# Patient Record
Sex: Female | Born: 1980
Health system: Southern US, Community
[De-identification: ages and names within clinical notes are randomized; demographics above are authoritative.]

## PROBLEM LIST (undated history)

## (undated) DIAGNOSIS — E669 Obesity, unspecified: Secondary | ICD-10-CM

## (undated) DIAGNOSIS — K219 Gastro-esophageal reflux disease without esophagitis: Secondary | ICD-10-CM

## (undated) DIAGNOSIS — K5790 Diverticulosis of intestine, part unspecified, without perforation or abscess without bleeding: Secondary | ICD-10-CM

## (undated) DIAGNOSIS — D649 Anemia, unspecified: Secondary | ICD-10-CM

## (undated) DIAGNOSIS — K5900 Constipation, unspecified: Secondary | ICD-10-CM

## (undated) DIAGNOSIS — E079 Disorder of thyroid, unspecified: Secondary | ICD-10-CM

## (undated) DIAGNOSIS — K5792 Diverticulitis of intestine, part unspecified, without perforation or abscess without bleeding: Secondary | ICD-10-CM

## (undated) DIAGNOSIS — G479 Sleep disorder, unspecified: Secondary | ICD-10-CM

## (undated) DIAGNOSIS — T7840XA Allergy, unspecified, initial encounter: Secondary | ICD-10-CM

## (undated) DIAGNOSIS — H04123 Dry eye syndrome of bilateral lacrimal glands: Secondary | ICD-10-CM

## (undated) HISTORY — DX: Allergy, unspecified, initial encounter: T78.40XA

## (undated) HISTORY — DX: Sleep disorder, unspecified: G47.9

## (undated) HISTORY — DX: Gastro-esophageal reflux disease without esophagitis: K21.9

## (undated) HISTORY — DX: Constipation, unspecified: K59.00

## (undated) HISTORY — PX: IUD REMOVAL: SHX5392

## (undated) HISTORY — PX: TUBAL LIGATION: SHX77

## (undated) HISTORY — DX: Disorder of thyroid, unspecified: E07.9

## (undated) HISTORY — DX: Anemia, unspecified: D64.9

## (undated) HISTORY — DX: Dry eye syndrome of bilateral lacrimal glands: H04.123

---

## 2000-12-01 ENCOUNTER — Emergency Department (HOSPITAL_COMMUNITY): Admission: EM | Admit: 2000-12-01 | Discharge: 2000-12-01 | Payer: Self-pay | Admitting: Emergency Medicine

## 2001-03-18 ENCOUNTER — Emergency Department (HOSPITAL_COMMUNITY): Admission: EM | Admit: 2001-03-18 | Discharge: 2001-03-18 | Payer: Self-pay | Admitting: *Deleted

## 2002-01-04 ENCOUNTER — Inpatient Hospital Stay (HOSPITAL_COMMUNITY): Admission: AD | Admit: 2002-01-04 | Discharge: 2002-01-04 | Payer: Self-pay | Admitting: *Deleted

## 2002-01-07 ENCOUNTER — Inpatient Hospital Stay (HOSPITAL_COMMUNITY): Admission: AD | Admit: 2002-01-07 | Discharge: 2002-01-07 | Payer: Self-pay | Admitting: *Deleted

## 2002-02-03 ENCOUNTER — Inpatient Hospital Stay (HOSPITAL_COMMUNITY): Admission: AD | Admit: 2002-02-03 | Discharge: 2002-02-03 | Payer: Self-pay | Admitting: Obstetrics and Gynecology

## 2002-02-15 ENCOUNTER — Emergency Department (HOSPITAL_COMMUNITY): Admission: EM | Admit: 2002-02-15 | Discharge: 2002-02-15 | Payer: Self-pay | Admitting: Emergency Medicine

## 2002-02-26 ENCOUNTER — Other Ambulatory Visit: Admission: RE | Admit: 2002-02-26 | Discharge: 2002-02-26 | Payer: Self-pay | Admitting: Obstetrics and Gynecology

## 2002-05-07 ENCOUNTER — Ambulatory Visit (HOSPITAL_COMMUNITY): Admission: RE | Admit: 2002-05-07 | Discharge: 2002-05-07 | Payer: Self-pay | Admitting: Obstetrics and Gynecology

## 2002-05-07 ENCOUNTER — Encounter: Payer: Self-pay | Admitting: Obstetrics and Gynecology

## 2002-06-04 ENCOUNTER — Encounter: Payer: Self-pay | Admitting: Obstetrics and Gynecology

## 2002-06-04 ENCOUNTER — Observation Stay (HOSPITAL_COMMUNITY): Admission: AD | Admit: 2002-06-04 | Discharge: 2002-06-05 | Payer: Self-pay | Admitting: *Deleted

## 2002-06-05 ENCOUNTER — Encounter: Payer: Self-pay | Admitting: Obstetrics and Gynecology

## 2002-06-10 ENCOUNTER — Observation Stay (HOSPITAL_COMMUNITY): Admission: AD | Admit: 2002-06-10 | Discharge: 2002-06-10 | Payer: Self-pay | Admitting: Obstetrics and Gynecology

## 2002-06-10 ENCOUNTER — Encounter: Payer: Self-pay | Admitting: Obstetrics and Gynecology

## 2002-06-11 ENCOUNTER — Encounter: Payer: Self-pay | Admitting: Obstetrics and Gynecology

## 2002-06-11 ENCOUNTER — Inpatient Hospital Stay (HOSPITAL_COMMUNITY): Admission: AD | Admit: 2002-06-11 | Discharge: 2002-06-11 | Payer: Self-pay | Admitting: *Deleted

## 2002-06-12 ENCOUNTER — Inpatient Hospital Stay (HOSPITAL_COMMUNITY): Admission: AD | Admit: 2002-06-12 | Discharge: 2002-06-22 | Payer: Self-pay | Admitting: Obstetrics and Gynecology

## 2002-06-12 ENCOUNTER — Encounter: Payer: Self-pay | Admitting: Obstetrics and Gynecology

## 2002-06-12 ENCOUNTER — Encounter (HOSPITAL_COMMUNITY): Admission: RE | Admit: 2002-06-12 | Discharge: 2002-06-30 | Payer: Self-pay | Admitting: Obstetrics and Gynecology

## 2002-06-16 ENCOUNTER — Encounter: Payer: Self-pay | Admitting: Obstetrics and Gynecology

## 2002-06-19 ENCOUNTER — Encounter: Payer: Self-pay | Admitting: Obstetrics and Gynecology

## 2002-06-23 ENCOUNTER — Encounter: Payer: Self-pay | Admitting: Obstetrics and Gynecology

## 2002-06-24 ENCOUNTER — Encounter: Payer: Self-pay | Admitting: Obstetrics and Gynecology

## 2002-06-29 ENCOUNTER — Inpatient Hospital Stay (HOSPITAL_COMMUNITY): Admission: AD | Admit: 2002-06-29 | Discharge: 2002-07-03 | Payer: Self-pay | Admitting: Obstetrics and Gynecology

## 2002-06-29 ENCOUNTER — Encounter: Payer: Self-pay | Admitting: Obstetrics and Gynecology

## 2002-06-29 ENCOUNTER — Encounter (INDEPENDENT_AMBULATORY_CARE_PROVIDER_SITE_OTHER): Payer: Self-pay

## 2002-06-30 ENCOUNTER — Encounter: Payer: Self-pay | Admitting: Obstetrics and Gynecology

## 2003-07-16 ENCOUNTER — Emergency Department (HOSPITAL_COMMUNITY): Admission: EM | Admit: 2003-07-16 | Discharge: 2003-07-16 | Payer: Self-pay | Admitting: Emergency Medicine

## 2003-07-16 ENCOUNTER — Encounter: Payer: Self-pay | Admitting: Emergency Medicine

## 2003-08-13 ENCOUNTER — Emergency Department (HOSPITAL_COMMUNITY): Admission: EM | Admit: 2003-08-13 | Discharge: 2003-08-13 | Payer: Self-pay

## 2003-08-16 ENCOUNTER — Emergency Department (HOSPITAL_COMMUNITY): Admission: EM | Admit: 2003-08-16 | Discharge: 2003-08-17 | Payer: Self-pay | Admitting: *Deleted

## 2004-02-11 ENCOUNTER — Emergency Department (HOSPITAL_COMMUNITY): Admission: EM | Admit: 2004-02-11 | Discharge: 2004-02-11 | Payer: Self-pay | Admitting: Emergency Medicine

## 2004-07-20 ENCOUNTER — Emergency Department (HOSPITAL_COMMUNITY): Admission: EM | Admit: 2004-07-20 | Discharge: 2004-07-20 | Payer: Self-pay | Admitting: Emergency Medicine

## 2005-10-10 ENCOUNTER — Emergency Department (HOSPITAL_COMMUNITY): Admission: EM | Admit: 2005-10-10 | Discharge: 2005-10-11 | Payer: Self-pay | Admitting: Emergency Medicine

## 2005-10-12 ENCOUNTER — Emergency Department: Payer: Self-pay | Admitting: Emergency Medicine

## 2006-10-05 ENCOUNTER — Emergency Department: Payer: Self-pay | Admitting: Emergency Medicine

## 2007-05-17 ENCOUNTER — Emergency Department: Payer: Self-pay | Admitting: Emergency Medicine

## 2007-08-11 ENCOUNTER — Inpatient Hospital Stay (HOSPITAL_COMMUNITY): Admission: AD | Admit: 2007-08-11 | Discharge: 2007-08-11 | Payer: Self-pay | Admitting: Obstetrics & Gynecology

## 2007-08-12 ENCOUNTER — Ambulatory Visit (HOSPITAL_COMMUNITY): Admission: RE | Admit: 2007-08-12 | Discharge: 2007-08-12 | Payer: Self-pay | Admitting: Obstetrics & Gynecology

## 2007-09-04 ENCOUNTER — Ambulatory Visit: Payer: Self-pay | Admitting: Obstetrics & Gynecology

## 2007-09-04 ENCOUNTER — Encounter: Payer: Self-pay | Admitting: Obstetrics & Gynecology

## 2007-09-05 ENCOUNTER — Ambulatory Visit (HOSPITAL_COMMUNITY): Admission: RE | Admit: 2007-09-05 | Discharge: 2007-09-05 | Payer: Self-pay | Admitting: Obstetrics & Gynecology

## 2007-10-09 ENCOUNTER — Ambulatory Visit: Payer: Self-pay | Admitting: Obstetrics and Gynecology

## 2007-11-24 ENCOUNTER — Ambulatory Visit: Payer: Self-pay | Admitting: Obstetrics & Gynecology

## 2007-11-24 ENCOUNTER — Ambulatory Visit (HOSPITAL_COMMUNITY): Admission: RE | Admit: 2007-11-24 | Discharge: 2007-11-24 | Payer: Self-pay | Admitting: Obstetrics & Gynecology

## 2007-11-24 ENCOUNTER — Encounter: Payer: Self-pay | Admitting: Obstetrics & Gynecology

## 2007-11-29 ENCOUNTER — Ambulatory Visit: Payer: Self-pay | Admitting: Family Medicine

## 2007-11-29 ENCOUNTER — Inpatient Hospital Stay (HOSPITAL_COMMUNITY): Admission: AD | Admit: 2007-11-29 | Discharge: 2007-11-29 | Payer: Self-pay | Admitting: Family Medicine

## 2008-01-07 ENCOUNTER — Ambulatory Visit: Payer: Self-pay | Admitting: Obstetrics and Gynecology

## 2009-01-24 ENCOUNTER — Emergency Department (HOSPITAL_COMMUNITY): Admission: EM | Admit: 2009-01-24 | Discharge: 2009-01-25 | Payer: Self-pay | Admitting: Emergency Medicine

## 2009-05-11 ENCOUNTER — Emergency Department (HOSPITAL_COMMUNITY): Admission: EM | Admit: 2009-05-11 | Discharge: 2009-05-12 | Payer: Self-pay | Admitting: Emergency Medicine

## 2009-11-25 ENCOUNTER — Ambulatory Visit: Payer: Self-pay | Admitting: Diagnostic Radiology

## 2009-11-25 ENCOUNTER — Emergency Department (HOSPITAL_BASED_OUTPATIENT_CLINIC_OR_DEPARTMENT_OTHER): Admission: EM | Admit: 2009-11-25 | Discharge: 2009-11-25 | Payer: Self-pay | Admitting: Emergency Medicine

## 2010-01-09 ENCOUNTER — Emergency Department (HOSPITAL_COMMUNITY): Admission: EM | Admit: 2010-01-09 | Discharge: 2010-01-10 | Payer: Self-pay | Admitting: Emergency Medicine

## 2010-04-24 ENCOUNTER — Emergency Department (HOSPITAL_COMMUNITY): Admission: EM | Admit: 2010-04-24 | Discharge: 2010-04-25 | Payer: Self-pay | Admitting: Emergency Medicine

## 2010-06-17 ENCOUNTER — Emergency Department: Payer: Self-pay | Admitting: Emergency Medicine

## 2011-01-14 LAB — URINALYSIS, ROUTINE W REFLEX MICROSCOPIC
Bilirubin Urine: NEGATIVE
Glucose, UA: NEGATIVE mg/dL
Hgb urine dipstick: NEGATIVE
Ketones, ur: NEGATIVE mg/dL
Nitrite: NEGATIVE
Protein, ur: NEGATIVE mg/dL
Specific Gravity, Urine: 1.023 (ref 1.005–1.030)
Urobilinogen, UA: 1 mg/dL (ref 0.0–1.0)
pH: 6 (ref 5.0–8.0)

## 2011-01-14 LAB — DIFFERENTIAL
Basophils Absolute: 0 10*3/uL (ref 0.0–0.1)
Basophils Relative: 0 % (ref 0–1)
Eosinophils Absolute: 0.1 10*3/uL (ref 0.0–0.7)
Eosinophils Relative: 1 % (ref 0–5)
Lymphocytes Relative: 12 % (ref 12–46)
Lymphs Abs: 1.5 10*3/uL (ref 0.7–4.0)
Monocytes Absolute: 0.5 10*3/uL (ref 0.1–1.0)
Monocytes Relative: 4 % (ref 3–12)
Neutro Abs: 10.7 10*3/uL — ABNORMAL HIGH (ref 1.7–7.7)
Neutrophils Relative %: 84 % — ABNORMAL HIGH (ref 43–77)

## 2011-01-14 LAB — POCT I-STAT, CHEM 8
BUN: 5 mg/dL — ABNORMAL LOW (ref 6–23)
Calcium, Ion: 0.96 mmol/L — ABNORMAL LOW (ref 1.12–1.32)
Chloride: 108 mEq/L (ref 96–112)
Creatinine, Ser: 0.9 mg/dL (ref 0.4–1.2)
Glucose, Bld: 92 mg/dL (ref 70–99)
HCT: 39 % (ref 36.0–46.0)
Hemoglobin: 13.3 g/dL (ref 12.0–15.0)
Potassium: 3.3 mEq/L — ABNORMAL LOW (ref 3.5–5.1)
Sodium: 136 mEq/L (ref 135–145)
TCO2: 19 mmol/L (ref 0–100)

## 2011-01-14 LAB — POCT PREGNANCY, URINE: Preg Test, Ur: NEGATIVE

## 2011-01-14 LAB — CBC
HCT: 36.3 % (ref 36.0–46.0)
Hemoglobin: 12 g/dL (ref 12.0–15.0)
MCH: 24.2 pg — ABNORMAL LOW (ref 26.0–34.0)
MCHC: 33 g/dL (ref 30.0–36.0)
MCV: 73.2 fL — ABNORMAL LOW (ref 78.0–100.0)
Platelets: 266 10*3/uL (ref 150–400)
RBC: 4.96 MIL/uL (ref 3.87–5.11)
RDW: 17.6 % — ABNORMAL HIGH (ref 11.5–15.5)
WBC: 12.8 10*3/uL — ABNORMAL HIGH (ref 4.0–10.5)

## 2011-01-15 LAB — GLUCOSE, CAPILLARY: Glucose-Capillary: 96 mg/dL (ref 70–99)

## 2011-03-13 NOTE — Op Note (Signed)
NAME:  Rebecca Watson, Rebecca Watson               ACCOUNT NO.:  [id number]   MEDICAL RECORD NO.:  [id number]          PATIENT TYPE:  AMB   LOCATION:  SDC                           [facility]ACILITY:  [facility]   PHYSICIAN:  Norton Blizzard, MD    DATE OF BIRTH:  [birth year]   DATE OF PROCEDURE:  11/24/2007  DATE OF DISCHARGE:                               OPERATIVE REPORT   PREOPERATIVE DIAGNOSIS:  1. Multiparity, desires sterilization.  2. Right ovarian dermoid cyst.  3. Abdominal wall nodule.  4. Retained IUD in uterus, unable to be removed in office.   POSTOPERATIVE DIAGNOSIS:  1. Multiparity, desires sterilization.  2. Right ovarian dermoid cyst.  3. Abdominal wall nodule.  4. Retained IUD in uterus, unable to be removed in office.   PROCEDURE:  1. Laparoscopic right ovarian cystectomy.  2. Laparoscopic bilateral tubal ligation.  3. Minilaparotomy and excision of abdominal wall nodule.  4. IUD removal after dilation and mild curettage.   SURGEON:  Norton Blizzard, M.D.   ASSISTANTMichele Mcalpine D. Okey Dupre, M.D.   ANESTHESIA:  General and local.   FLUIDS REPLACED:  1500 mL of Ringer's lactate.   ESTIMATED BLOOD LOSS:  100 mL.   URINE OUTPUT:  Minimal.   INDICATIONS FOR PROCEDURE:  The patient is a 30 year old para 2, who was  seen in the clinic with multiple complaints.  The patient has a long  history of abdominal pain and a mass in the anterior wall of her  abdomen.  The patient had a cesarean section many years ago, and she had  this pain starting in July of 2008.  On examination, she was noted to  have a 2 cm nodule on the right edge of her previous cesarean section  scar which was also very tender to touch.  On the CT scan she was noted  to have solid enhancing mass within the deep subcutaneous fat of the  right anterior abdominal wall superficial to the right rectus muscle.  Incidentally, she was noted to have a right ovarian complex lesion that  measures about 2 cm concerning for a small ovarian  dermoid cyst.  The  patient also desired a tubal ligation for contraception.  She does have  a history of IUD that was inserted in 2003 and she desired removal of  this IUD.  In the office, the IUD was attempted to be removed, but there  was no string visualized.  A subsequent ultrasound did show that the IUD  was in the correct place in the uterus, and also showed that the cyst  was 1.8 cm and consistent with a dermoid.  Given these multiple  problems, the patient desired surgical management.  She was counseled  regarding the laparoscopic tubal ligation, the permanence of the method,  the irreversibility of the method, and also discussed the risk of  failure of 1 in 200 and increased risk of ectopic gestation if failure  occurs.  Furthermore, the risks of surgery including bleeding,  infection, injury to surrounding organs, and need for additional  procedures were discussed with the patient, and written informed  consent  was obtained.   FINDINGS:  A 2 cm right ovarian dermoid cyst.  Normal uterus, tubes, and  left ovary.  A 2 cm round abdominal wall nodule in subcutaneous tissue  on the right side.  IUD removed intact.   SPECIMENS:  Right ovarian dermoid, abdominal wall nodule, IUD and  endometrial curettings.   DISPOSITION:  Specimens to pathology.   DESCRIPTION OF PROCEDURE:  The patient received preoperative antibiotics  30 minutes prior to the procedure.  She was then taken to the operating  room where general anesthesia was placed and found to be adequate.  She  was placed in the dorsal lithotomy position, and prepped and draped in  the usual sterile fashion.  A red rubber catheter was used to  catheterize the patient of minimal clear yellow urine, and a uterine  manipulator was placed.  Attention was then turned to the patient's  abdomen where an umbilical incision was made.  The Veress needle was  then inserted with the CO2 gas supply on, and correct intraperitoneal   placement was confirmed with a low opening pressure of 5 mmHg.  The  abdomen was insufflated to 15 mmHg and the 11 mm port was then placed at  this point.  The laparoscope was then inserted and also confirmed  correct placement of the trocar.  Two ports were then placed on the  patient's left lower quadrant about 7 cm apart in vertical dimension.  Attention was then turned to the patient's right ovary where the dermoid  cyst was noted.  An incision was made over the ovarian cortex and at  this point it was noted that the dermoid cyst did rupture.  The suction  catheter was used to irrigate and suction out all of the contents of the  dermoid cyst and the pelvis was copiously irrigated.  The cyst wall was  identified, grasped, and gently peeled off the surrounding cortex.  The  specimen was removed from the patient's abdomen.  Hemostasis was  obtained by using the Gyrus instrument at the base of the cyst.  Overall  good hemostasis was noted.  Further irrigation was performed until there  was no residual sebaceous material or hair noted in the abdomen.  Attention was then turned to the patient's fallopian tubes which were  identified bilaterally, and Filshie clips were placed in the isthmic  region of both tubes and noted to encompass entire width of the tubes.  Excellent hemostasis was noted overall.  The abdomen was then  desufflated.  Attention was then turned to the patient's abdominal wall  where a 2 cm nodule was palpated above her prior cesarean section scar.  A 3 cm incision was then made over this nodule and carried through to  where the nodule was encountered.  The nodule was grasped with a triple  hook, and separated bluntly and sharply from the surrounding  subcutaneous tissues.  This nodule was removed intact and sent to  pathology.  The nodule was noted not to involve the fascia, but just was  imbedded in the subcutaneous tissue, and so no fascial dehiscence or  separation was  noted.  The subcutaneous tissue was irrigated, and  hemostasis was obtained using coagulation.  The dead space was  reapproximated using 3-0 plain gut, and the skin incision was also  reapproximated using 3-0 plain gut in a subcuticular stitch.  Attention  was then turned to the patient's uterus where the uterine manipulator  was removed, and a speculum  was placed in the patient's vagina.  A  tenaculum was then placed on the anterior lip of the cervix and a  paracervical block was then administered using 1% lidocaine.  The cervix  was dilated to accommodate the polyp forceps.  The polyp forceps were  then used to encounter the IUD in the uterine cavity and IUD was removed  intact.  There was a copious amount of endometrial tissue was noted and  a small amount of this was sent to pathology for analysis.  All  instruments were then removed from below.  The laparoscope was then used  to take a look from above and confirmed no uterine injury from the IUD  removal, and also confirmed that the ovarian lesion was hemostatic and  that there were no other complications.  The laparoscope and other  instruments were then removed, and the 11 mm fascial incision in the  umbilical port was repaired using 2-0 plain gut in an interrupted  stitch. All skin incisions were closed using interrupted sutures of 3-0  plain.  The patient tolerated the procedure well.  Needle, sponge, and  instrument counts correct x2.  She was taken to the recovery room,  extubated, awake, and in stable condition.      Norton Blizzard, MD  Electronically Signed     UAD/MEDQ  D:  11/24/2007  T:  11/24/2007  Job:  [id number]

## 2011-03-13 NOTE — Group Therapy Note (Signed)
NAME:  Rebecca Watson, BORDAS NO.:  [id number]   MEDICAL RECORD NO.:  [id number]          PATIENT TYPE:  WOC   LOCATION:  [facility]                   FACILITY:  [facility]   PHYSICIAN:  Argentina Donovan, MD        DATE OF BIRTH:  1981/10/03   DATE OF SERVICE:  10/09/2007                                  CLINIC NOTE   The patient is a 30 year old gravida 1, para 1-0-0-2 who has twins 31  years old.  Over the past year has had a nodule in the anterior  abdominal wall just above the right lateral tip of her cesarean section  scar that is a painful nodule.  Does not seem to change with her periods  and is not reducible.  Is firm in configuration and consistency.  She  was seen by Dr. Silas Flood and I am scheduling her for removal of that.  She  also desired to have a tubal ligation and talked to Dr. Silas Flood, and I will  go ahead and schedule that and have her come and see Dr. Silas Flood prior to  the surgery.  In addition she has a small asymptomatic complex dermoid  tumor 1.8 cm on the right ovary.  Whether or not to remove that I will  leave up to Dr. Jasmine December discretion.   IMPRESSION:  Painful nodule abdominal wall, desires sterilization,  perhaps removal of ovarian cyst.           ______________________________  Argentina Donovan, MD     PR/MEDQ  D:  10/09/2007  T:  10/10/2007  Job:  [id number]

## 2011-03-13 NOTE — Group Therapy Note (Signed)
NAME:  Rebecca Watson, Rebecca Watson NO.:  [id number]   MEDICAL RECORD NO.:  [id number]          PATIENT TYPE:  WOC   LOCATION:  [facility]                   FACILITY:  [facility]   PHYSICIAN:  Argentina Donovan, MD        DATE OF BIRTH:  Apr 21, 1981   DATE OF SERVICE:  01/07/2008                                  CLINIC NOTE   The patient is a 30 year old African-American female who on January 26  underwent laparoscopic removal of a dermoid cyst from the right ovary,  bilateral tubal ligation, abdominal wall nodule removal which turned out  to be endometriosis, and removal of an IUD.  Since that time, her  periods have gotten heavier.  She has had chronic headaches since the  surgery.  Other than that, she has healed up well and feels well.  She  went to give blood last week and they told her hemoglobin was too low  and put her on some iron.  I am going to repeat that today to get a  baseline.  I have told her that not having the IUD and having a tubal  may increase her bleeding and it may be enough that she has to use  something to control the bleeding.  The headaches could be related to  her anemia if she truly is anemic, but she describes somewhat of a  migraine type of unilateral especially on the right side and almost  constant.  They do go away occasionally but they do come back.  There is  no warning when they are coming back.  She often wakes up with them.  They seem to get worse as the day goes on.  She does not feel any  prodromal aura or any post headache sequelae, and her extremities she  has  not noticed that they feel colder with the headaches effect.  So I  am some going to give her some Fiorinal to try.  I think it sounds like  a migraine to me.  In the beginning, I will give her some Fiorinal in  caps to try, and if they persist and they are not better as the anemia  is improved, I think that we may have to go further and think about  suppression, prevention, and perhaps  further diagnosis.           ______________________________  Argentina Donovan, MD     PR/MEDQ  D:  01/07/2008  T:  01/08/2008  Job:  [id number]

## 2011-03-13 NOTE — Group Therapy Note (Signed)
NAME:  Rebecca Watson, Rebecca Watson NO.:  [id number]   MEDICAL RECORD NO.:  [id number]          PATIENT TYPE:  WOC   LOCATION:  [facility]                   FACILITY:  [facility]   PHYSICIAN:  Johnella Moloney, MD        DATE OF BIRTH:  09-Jul-1981   DATE OF SERVICE:  09/04/2007                                  CLINIC NOTE   CHIEF COMPLAINT:  Yearly examination, abdominal mass.   HISTORY OF PRESENT ILLNESS:  The patient is a 30 year old G1, P1-0-0-2,  who is here to initiate GYN care. The patient is also here for her  yearly examination and also as a followup from the emergency room  concerning abdominal pain and incisional mass. The patient was seen in  the emergency room on August 11, 2007 for pain that has been present  since July of 2008. On evaluation, she was noted to have a nodule about  2-cm on the right edge of her previous Cesarean section scar which was  tender to touch. She was then scheduled for a CT scan for further  evaluation. On the CT scan, the patient was noted to have solid  enhancing mass within the deep subcutaneous fat of the right anterior  abdominal wall, superficial to the right rectus abdominis muscle. This  mass measured 2.2 x 2.3 cm and a differential diagnosis with  endometrioma, granulation tissue or desmoid tumor. Of note, the patient  was incidentally noted to have small complex cystic lesion with the  right ovary measuring 2.2 x 3.8 cm which is concerning for a possible  small ovarian dermoid. The patient at this point continues to report  having intermittent pain of abdomen nodule. She denies any other  symptoms.   REVIEW OF SYSTEMS:  The patient denies any other symptoms.   PAST GYN HISTORY:  Menarche at age 30. Regular menstrual cycles that  last five days with medium flow. The patient does have some  dysmenorrhea. The patient has prior IUD that was inserted in 2003 for  contraception. She does not remember when her last pap smear was as she  does  not remember having any abnormal pap smears.   PAST OB HISTORY:  G1, P1-0-0-2 with one Cesarean section.   PAST MEDICAL HISTORY:  1. Arthritis.  2. Obesity.   PAST SURGICAL HISTORY:  Cesarean section.   SOCIAL HISTORY:  The patient smokes one pack per day and she has been  smoking for 10 years. Denies any alcohol or illicit drugs.   FAMILY HISTORY:  Remarkable for diabetes, high blood pressure, blood  clots and cancer in the mom.   PHYSICAL EXAMINATION:  Temperature 97.6, pulse 69, blood pressure  121/72, weight 248.4 pounds, height 5 feet, 4 inches.  GENERAL: In no apparent distress.  LUNGS:  Clear to auscultation.  HEART: Regular rate and rhythm.  BREASTS: Symmetrical. No abnormal masses. No tenderness. No skin  changes.  ABDOMEN: Soft, obese and 2-cm nodule is palpated at right aspect of her  Cesarean section scar. Moderate tenderness to palpation in this area,  but otherwise no other tenderness noted abdominally.  PELVIC: Normal external female  genitalia. Pink, well rugated vagina.  Normal discharge. Nulliparous cervical os noted with normal discharge.  Pap smear sample obtained. On bimanual examination, normal sized uterus.  No IUD strings were visualized or seen. However, the IUD was noted to be  in the correct position on the CT scan.   IMPRESSION AND PLAN:  The patient is a 30 year old G1, P2 who is here  for a yearly examination and also for evaluation of an incisional mass.  The patient has persistent pain. She is a candidate for surgical removal  of this mass. We will need pathological diagnosis to be definitely sure  about what the mass is. As for her __________ will get an ultrasound for  better visualization and characterization of the ovarian cyst. If it is  a dermoid on the ultrasound would proceed with surgical plan for  possible laparoscopy removal of the cyst. Of note, the patient does  report that she wants a bilateral tubal ligation.  She was counseled   regarding this and will sign a form today. Will continue to counsel her  about this at her next visits and emphasize that she is young and could  still have further desire for childbearing. Will continue to discuss  this prior to surgery. The patient will return after ultrasound for her  preoperative visit and preoperative planning.           ______________________________  Johnella Moloney, MD     UD/MEDQ  D:  09/04/2007  T:  09/04/2007  Job:  [id number]

## 2011-03-16 NOTE — H&P (Signed)
NAME:  Rebecca Watson, Rebecca Watson                         ACCOUNT NO.:  [id number]   MEDICAL RECORD NO.:  [id number]                   PATIENT TYPE:  INP   LOCATION:  9160                                 [facility]ACILITY:  [facility]   PHYSICIAN:  Marie L. Williams, C.N.M.           DATE OF BIRTH:  12-05-1980   DATE OF ADMISSION:  06/12/2002  DATE OF DISCHARGE:                                HISTORY & PHYSICAL   HISTORY OF PRESENT ILLNESS:  The patient is a 30 year old gravida 1, para 0  at 32 weeks with twin gestation.  She has had twice weekly MSTs for  decreased fetal movement and has been noted to have abnormal Doppler's in  twin A.  Today's MST was not reactive and ultrasound evaluation was  performed.  Twin A was nonreactive on MST, with uterine artery Doppler's  increased at 4.28 and CA normal at 1.93, with a BPP of 6:8.  AFI was normal.  Twin B showed a nonreactive MST with U/A normal at 2.97 and MCA normal at  2.13.  BPP 6:8 and AFI normal.   CST was undertaken and Twin A showed deceleration from 140 to 120, lasting  three minutes.  Nipple stimulation was discontinued and admission was  recommended and accepted for continuous fetal heart rate tracing.  Dr.  Pennie Rushing advised [facility] consult and states she discussed considering delivery  at 32 weeks.   OBSTETRICAL HISTORY:  The patient is primigravida.   MEDICAL HISTORY:  Chlamydia at age 14.  Smokes 1/2 pack a day.   FAMILY HISTORY:  Remarkable for heart disease, chronic hypertension,  thrombophlebitis, questionable sickle cell, anemia, chronic obstructive  pulmonary disease, diabetes, arthritis, and leukemia in her mother.  Of the  aforementioned family histories, were associated with multiple family  members.   GENETIC HISTORY:  Remarkable for father of the baby's uncle with Down  Syndrome.  The patient's mother with sickle cell trait.  The patient's  cousin with twins, and patient's great aunt with two sets of twins.   SOCIAL HISTORY:  The  patient is single, involved with [employer], who is  involved and supportive.  She is of the [place] faith.  She denies any  alcohol, tobacco or drug use.   OBJECTIVE DATA:  VITAL SIGNS:  Stable, afebrile.  HEENT:  Within normal limits.  NECK:  Thyroid normal and not enlarged.  CHEST:  Clear to auscultation.  HEART:  Regular rate and rhythm.  ABDOMEN:  Gravid.  EFM as described previously; shows nonreactive tracings  in Twin A and Twin B, with one variable deceleration in Twin A.  Occasional  uterine contractions.  CERVICAL:  Deferred, as it was done earlier in the office. Cervix exam in  the office was closed, 50% and soft with presenting part high.  EXTREMITIES:  Within normal limits.   PRENATAL LABS:  Hemoglobin 12.0, platelets 270, blood type 0 positive.  Beta  and antibody screen  negative.  Sickle cell negative.  RPR nonreactive.  Hb  SAG negative.  HIV nonreactive.  Pap test normal.  Gonorrhea negative.  Chlamydia negative.  AFP free beta within normal limits.  Glucose challenge  within normal limits.   ASSESSMENT:  1. Intrauterine pregnancy at 31 weeks.  2. Twin gestation.  3. Abnormal Doppler studies in Twin A.  4. Questionable nonreassuring CST.   PLAN:  1. Admit to [facility].  2. Continuous fetal monitoring.  3. NICU consult.  4. Further plans per M.D.                                               Marie L. Mayford Knife, C.N.M.    MLW/MEDQ  D:  06/12/2002  T:  06/12/2002  Job:  [id number]

## 2011-03-16 NOTE — Discharge Summary (Signed)
NAME:  Rebecca Watson, Rebecca Watson                         ACCOUNT NO.:  [id number]   MEDICAL RECORD NO.:  [id number]                   PATIENT TYPE:  INP   LOCATION:  9154                                 [facility]   PHYSICIAN:  Crist Fat. Rivard, M.D.              DATE OF BIRTH:  03-10-1981   DATE OF ADMISSION:  06/12/2002  DATE OF DISCHARGE:  06/22/2002                                 DISCHARGE SUMMARY   ADMISSION DIAGNOSES:  1. Intrauterine pregnancy at 31 weeks of twin gestation.  2. Nonreactive NST on twin A with abnormal Doppler flow study and     nonreactive NST on twin B with normal Doppler studies and a B cell on     twin A during a contraction stress test.   DISCHARGE DIAGNOSES:  1. Intrauterine pregnancy at 32-5/7 weeks with twin gestation status post     betamethasone series. Normal growth and normal fluid on both babies.  2. Abnormal Dopplers on twin A with elevated UA ratio and normal MCA.  3. Occasional random decelerations on both babies with overall reassuring     fetal heart rate tracings.   PROCEDURES ON THIS ADMISSION:  None.   HOSPITAL COURSE:  The patient is a 30 year old black female gravida 1, para  0 at 31 weeks on admission admitted with twins secondary to abnormal Doppler  flow studies and nonreactive NSTs and decelerations noted on twin A.  She  has had continuous monitoring throughout her hospital stay and is noted to  have overall reassuring tracings with occasional apparently random fetal  heart rate decelerations that returned spontaneously to baseline. None of  the decelerations have been repetitive. She has also had periods of  reactivity for both babies and has had several ultrasounds during this  hospitalization. Her most recent ultrasound was on 06-19-02 that showed  normal growth and normal fluid for both babies. It showed an S/D ratio of  5.7 on twin A and an MCA of 1.71 and a 6 out of 8 on a biophysical profile  and on twin B has an S/D ratio of  3.3 and an MCA of 2.8; both of those  within normal limits.  Also, normal growth and normal fluid and 6 out of 8  on a biophysical profile. The patient has had a long discussion with Dr.  Estanislado Pandy today regarding the options of continue with observation and fetal  heart rate monitoring versus home on bed rest and antenatal testing.  She  does understand the potential risk of missing an episode of acute distress  for either baby since she will note be continuously monitored but is  desiring discharge to home such the same.  Her vital signs are stable.  She  is afebrile and she has had no other complications during her hospital stay.   DISCHARGE FOLLOW UP:  Her discharge follow up will be twice weekly NSTs and  once  week sonogram with Doppler studies. She is also to call for decreased  fetal movement, signs of preterm labor. Dr. Estanislado Pandy also plans amniocentesis  around 34 weeks for fetal lung maturity and subsequent C-section if fetal  lungs are mature.   DISCHARGE MEDICATIONS:  She is to continue on prenatal vitamins.   DISCHARGE LABORATORY:  Her hemoglobin is 10.0. WBC is 7.6. Platelets are  280. PR was negative.     Concha Pyo. Duplantis, C.N.M.              Crist Fat Rivard, M.D.    SJD/MEDQ  D:  06/22/2002  T:  06/22/2002  Job:  [id number]

## 2011-03-16 NOTE — Discharge Summary (Signed)
NAME:  Rebecca Watson, Rebecca Watson                         ACCOUNT NO.:  [id number]   MEDICAL RECORD NO.:  [id number]                   PATIENT TYPE:  INP   LOCATION:  9117                                 [facility]   PHYSICIAN:  Crist Fat. Rivard, M.D.              DATE OF BIRTH:  [birth year]   DATE OF ADMISSION:  06/29/2002  DATE OF DISCHARGE:  07/03/2002                                 DISCHARGE SUMMARY   ADMISSION DIAGNOSES:  1. Intrauterine pregnancy at 66 and 3/7 weeks.  2. Twin gestation.  3. Abnormal Doppler flow of twin A.  4. Normal growth, fluid, and biophysical profiles on both twins.  5. Abnormal lie of both twins.   DISCHARGE DIAGNOSES:  1. Intrauterine pregnancy at 29 and 3/7 weeks.  2. Twin gestation.  3. Abnormal Doppler flow of twin A.  4. Normal growth, fluid, and biophysical profiles on both twins.  5. Abnormal lie of both twins.  6. Nuchal cord x2 on twin A.  7. Status post primary low transverse cesarean section.   PROCEDURE:  1. Spinal anesthesia.  2. Primary low transverse cesarean section of twin A which was a female which     weighed 1770 g, footling breech, Apgars 4 and 8, cord pH 7.35 and twin B     which was a female, Apgars 7 and 9, no nuchal cord, and 2190 g.   HOSPITAL COURSE:  The patient was admitted for delivery following abnormal  Dopplers which were worsening in twin A.  She was status post betamethasone  series.  Twin A showed nonreactive NST with a deceleration and a BPP of 6/8.  AFI was normal.  Uterine artery Doppler was worsening to 5.78.  Twin B  showed a nonreactive NST, but a reassuring tracing with a BPP of 8/8 and  normal Dopplers.  The patient was admitted for elective primary low  transverse cesarean section which was performed the following two days later  by Dr. Normand Sloop secondary to a change in her MCA in twin A.  Surgery was  performed under spinal anesthesia with EBL of 1000 cc and no complications.  Twins were taken to the NICU in  stable condition and have continued to do  well.  During the postoperative course the patient has done well and has  been oriented to the NICU experience by the support staff.  Postoperative  day number three she was doing well; however, she was reluctant to leave her  babies for discharge.  Vital signs were stable.  Blood pressures were  running 120-140/70s with a single blood pressure of 122/92.  Chest was  clear.  Heart rate regular rate and rhythm.  Abdomen soft and appropriately  tender.  Incision was clean and dry with staples.  Lochia was small.  Extremities within normal limits.  The patient was deemed to have received  the full benefit of her hospital stay and was  discharged home.   DISCHARGE MEDICATIONS:  1. Motrin 600 mg p.o. q.6h. p.r.n.  2. Tylox one to two p.o. q.4h. p.r.n.   DISCHARGE LABORATORIES:  White blood cell count 8.3, hemoglobin 9.2,  hematocrit 27.9, platelets 241,000.  RPR nonreactive.   DISCHARGE INSTRUCTIONS:  Per [facility] handout.   DISCHARGE FOLLOWUP:  Six weeks or p.r.n.     Marie L. Williams, C.N.M.                 Crist Fat Rivard, M.D.    MLW/MEDQ  D:  07/03/2002  T:  07/03/2002  Job:  [id number]

## 2011-03-16 NOTE — Op Note (Signed)
NAME:  LYNETTE, TOPETE                         ACCOUNT NO.:  [id number]   MEDICAL RECORD NO.:  [id number]                   PATIENT TYPE:  INP   LOCATION:  9117                                 [facility]   PHYSICIAN:  Naima A. Dillard, M.D.              DATE OF BIRTH:  1980/12/31   DATE OF PROCEDURE:  06/30/2002  DATE OF DISCHARGE:                                 OPERATIVE REPORT   PREOPERATIVE DIAGNOSIS:  Abnormal Dopplers on Baby A with abnormal middle  cerebral artery and fetal decelerations, and heart rate at 33-4/7 weeks.  Total picture of nonreassuring fetal testing.   POSTOPERATIVE DIAGNOSIS:  Abnormal Dopplers on Baby A with abnormal middle  cerebral artery and fetal decelerations, and heart rate at 33-4/7 weeks.  Total picture of nonreassuring fetal testing.   PROCEDURE:  Primary low transverse cesarean section.   SURGEON:  Naima A. Normand Sloop, M.D.   ASSISTANT:  Concha Pyo. Duplantis, C.N.M.   ANESTHESIA:  Spinal.   ESTIMATED BLOOD LOSS:  1000 cc.   INTRAVENOUS FLUIDS:  4000 cc.   URINE OUTPUT:  225 cc clear urine at the end of the procedure.   COMPLICATIONS:  None.   FINDINGS:  Baby A, footling breech, weighing 1770 g with Apgars of 4 and 8  and a cord pH of 7.35.  Female infant with a nuchal cord x2.  There was clear  fluid.  Baby B was a female in footling breech presentation, Apgars of 7 and  8.  No nuchal cord.  Clear fluid.  The patient had normal appearing uterus,  tubes and ovaries.   DESCRIPTION OF PROCEDURE:  The patient was taken to the operating room.  She  was placed in the dorsal supine position after given a spinal anesthesia.  She was then prepped and draped in the normal sterile fashion with the Foley  placed. After her spinal anesthesia was found to be adequate, a Pfannenstiel  skin incision was then made with the scalpel and carried down to the fascia  using Bovie cautery.  The fascia was then incised in the midline and  extended bilaterally  using Bovie cautery.  Kochers x2 were placed on the  superior aspect of the fascia which was dissected off of the rectus muscles  both superiorly and inferiorly with good visualization of bowel and bladder.  The inferior aspect of the fascia was separated off the rectus muscle in a  similar fashion.  The rectus muscles were then separated in the midline.  The peritoneum was identified, tented up and then sharply extended  superiorly and inferiorly with good visualization of bowel and bladder.  The  bladder blade was inserted.  The vesicouterine peritoneum was identified,  tented up and entered sharply with Metzenbaum scissors.  The bladder flap  was created both sharply and bluntly.  The bladder blade was reinserted.  A  primary low transverse uterine incision was then  made with the scalpel and  extended bluntly.  The membranes were ruptured.  Baby A was noted to be  footling breech and delivered with breech maneuvers without difficulty, and  had a nuchal cord x2.  The cord was clamped and cut.  Cord gases were sent.  The baby was handed over to the waiting pediatricians.  Baby  B's sac was  then ruptured, clear fluid, a footling breech was delivered without  difficulty.  The cord was clamped and cut and handed off to the awaiting  pediatricians.  The placenta was manually removed.  The uterus was cleared  of all clot and debris.  The cervix was then dilated manually with my  finger.  A second glove was then placed on the left hand.  Ancef 1 g was  given.  The uterus was repaired with 0 Vicryl in a running locked fashion.  A second layer of imbrication was placed with 0 Vicryl.  The abdomen was  irrigated with saline.  Gutters were cleared of all clot and debris.  The  cuff was then closed with 2-0 chromic.  The fascia was closed with 0 Vicryl  in a running fashion.  Hemostasis was assured.  Subcutaneous bleeder was  made hemostatic with Bovie cautery in any bleeding areas.  Skin was  closed  with staples. Sponge, lap and needle counts were correct x2.  The patient  went to the recovery room in stable condition.                                                Naima A. Normand Sloop, M.D.    NAD/MEDQ  D:  06/30/2002  T:  07/01/2002  Job:  [id number]

## 2011-03-16 NOTE — H&P (Signed)
NAME:  Rebecca Watson, Rebecca Watson                         ACCOUNT NO.:  [id number]   MEDICAL RECORD NO.:  [id number]                   PATIENT TYPE:  INP   LOCATION:  9153                                 [facility]   PHYSICIAN:  Crist Fat. Rivard, M.D.              DATE OF BIRTH:  13-Aug-1981   DATE OF ADMISSION:  06/29/2002  DATE OF DISCHARGE:                                HISTORY & PHYSICAL   HISTORY OF PRESENT ILLNESS:  The patient is a 30 year old, gravida 1, para  0, with twin gestation at 33-3/7 weeks, who presented for NST today.  She  had a variable deceleration on twin A.  She was sent to ultrasound for BPP  and Dopplers.  After a nonstress test, Dopplers on twin A showed a umbilical  artery systolic/diastolic ratio of 5.78, which was elevated from her  previous value of 4.38.  She has had elevated uterine artery Dopplers on  twin A for the last several weeks and has been followed by biweekly NSTs and  weekly BPPs and AFIs.  Fluid, growth, and BPPs have been within normal  limits on both babies and the issues has been much appropriate management  for observation of these abnormal Dopplers.  In light of the elevated  uterine artery Doppler even above where it has been previously and the  occurrence of a variable deceleration x 1 on the monitor on twin A, the  patient is to be admitted for 23-hour observation.  The pregnancy has been  remarkable for:  1.  Abnormal Doppler uterine artery on twin A for the last  several weeks.  2.  Questionable LMP.  3.  Previous smoker.  4.  Twin  gestation.   PRENATAL LABORATORY DATA:  Blood type is O+.  Rh antibody negative.  VDRL  nonreactive.  Rubella titer positive.  Hepatitis B surface antigen negative.  HIV nonreactive.  Sickle cell negative.  GC and chlamydia cultures were  normal.  Pap was normal.  The glucose challenge was normal.  The AFP was  normal.  The hemoglobin upon entering the practice was 12.  It was 10.2 at  28 weeks.  An EDC of  August 14, 2002, was established by last menstrual  period and was in agreement at ultrasound at approximately 17 weeks.   HISTORY OF PRESENT PREGNANCY:  The patient was procured at approximately 15  weeks.  She had an ultrasound at 17 weeks which identified twin gestation.  The cervix was normal.  She had another ultrasound at 28 weeks that showed a  transverse lie and B transverse lie with normal growth.  She had another  ultrasound at approximately 30 weeks in which it showed elevated uterine  artery Doppler of twin A.  At that point, she was admitted and had another  Doppler flow study the next day with normalizing of Dopplers and then  subsequently the uterine artery Doppler has  again moved outside the limits  and has shown a slight increase over the last two to three weeks.  Her mean  cerebral artery of twin A Doppler has always been normal.  BPPs have always  been between 6/8 and 8/8.  Dopplers and growth on twin B have always been  within normal limits.  Again, BPPs have been between 6/8 and 8/8.  Growth  has been within normal limits and fluid has always been within normal  limits.  The cervix on ultrasound today was 4.0.  The position of twin A  today is breech with normal fluid.  The position of twin B today is  transverse with fluid within normal limits.   OBSTETRICAL HISTORY:  The patient is a primigravida.   PAST MEDICAL HISTORY:  The patient had chlamydia at age 73.  She was a  previous smoker of a half of a pack per day prior to the positive urine  pregnancy test.   ALLERGIES:  She has no known medication allergies.   FAMILY HISTORY:  Her paternal uncle had an MI.  Her paternal grandmother,  maternal grandmother, maternal grandfather, and mother all had chronic  hypertension.  Her paternal grandmother had thrombophlebitis.  Her sister  had anemia.  Her mother had questionable sickle cell.  Her niece has  emphysema.  Her paternal aunt and cousin have asthma.  Her  maternal  grandmother had noninsulin-dependent diabetes mellitus.  Her paternal  grandmother had insulin-dependent diabetes mellitus.  Her paternal  grandmother had questionable rheumatoid arthritis.  Her mother had leukemia  and is now deceased.  Her mother also had a stroke.  Her father and her  paternal uncles are alcohol users.  A paternal uncle is also a drug user.   GENETIC HISTORY:  Remarkable for the father of the infant's uncle having  Down syndrome.  The patient's mother had questionable sickle cell trait.  Her cousin had twins.  The patient's great-aunt had two sets of twins.   SOCIAL HISTORY:  The patient is single.  The father of the baby is involved  and supportive.  His name is Arlys John __________.  She is [place] and  of the [place] faith.  She has a GED.  She is a Futures trader.  Her partner is  in ninth grade __________.  She has been followed by the Physician Service  of [facility].  She denies any alcohol, drug, or tobacco use during  this pregnancy.   PHYSICAL EXAMINATION:  VITAL SIGNS:  Stable.  The patient is afebrile.  HEENT:  Within normal limits.  LUNGS:  Bilateral breath sounds are clear.  HEART:  Regular rate and rhythm without murmur.  BREASTS:  Soft and nontender.  ABDOMEN:  Gravid with a fundal height of approximately 40-42 cm.  On  ultrasound today, A is breech and B is transverse.  Fetal movement is noted.  The fetal heart rates are currently reassuring with no decelerations noted  at this time other than the original variable noted on the patient's first  tracing here in maternity admissions of twin A.  PELVIC:  The cervical is deferred at this time.  EXTREMITIES:  The deep tendon reflexes are 2+ without clonus.  There is  trace edema noted.   IMPRESSION:  1. Intrauterine twin pregnancy at 33-3/7 weeks.  2. Abnormal Doppler flow of twin A with slight elevation of her previous    values of uterine artery systolic/diastolic ratio.  3.  Normal growth, fluid, and biophysical  profiles on both twins.  4. Abnormal lie of both twins.   PLAN:  1. Admit to the [facility] for consult with Crist Fat.     Rivard, M.D., as attending physician for 23-hour observation.  2. Continuous electronic fetal monitoring.  3. M.D.'s will follow.     Renaldo Reel Emilee Hero, C.N.M.                   Crist Fat Rivard, M.D.    Leeanne Mannan  D:  06/29/2002  T:  06/29/2002  Job:  [id number]

## 2011-03-16 NOTE — Discharge Summary (Signed)
NAME:  Rebecca Watson, Rebecca Watson                         ACCOUNT NO.:  [id number]   MEDICAL RECORD NO.:  [id number]                   PATIENT TYPE:  OBV   LOCATION:  9198                                 [facility]   PHYSICIAN:  Janine Limbo, M.D.            DATE OF BIRTH:  1981/09/09   DATE OF ADMISSION:  06/04/2002  DATE OF DISCHARGE:  06/05/2002                                 DISCHARGE SUMMARY   ADMISSION DIAGNOSES:  1. Intrauterine pregnancy at [redacted] weeks gestation.  2. Twin gestation.  3. Obesity.  4. Nonreactive nonstress test and report of decreased fetal movement on Twin     A.   DISCHARGE DIAGNOSES:  1. Resolved fetal movement.  2. Normal ultrasound on both A and B.  3. Intrauterine pregnancy at 30 weeks with twin gestation.  4. Status post betamethasone series.   PROCEDURES THIS ADMISSION:  None.   HOSPITAL COURSE:  The patient is a 30 year old black female gravida 1 para 0  at [redacted] weeks gestation with twins who was sent over from [place]  [facility] for further evaluation secondary to report of decreased fetal  movement on Twin A and initially a nonreactive tracing on both fetuses.  She  did receive a dose of subcu terbutaline for some mild uterine contractions  and irritability and that resolved the uterine contractions.  She had an  ultrasound on June 04, 2002 that showed normal growth, normal fluid, and an  8/8 biophysical on Twin A but with the umbilical artery S/D ratio of greater  than 4.25 with the normal less than 3.5, and an MCA of greater than 1.6 with  the normal greater than 1.5.  Twin B also had normal growth and normal  fluid.  Biophysical is 6/8.  No breathing movements.  The umbilical artery  was normal and the MCA was unable to be obtained.  As a result, the patient  was admitted for overnight observation and repeat ultrasound on August 8 and  the repeat ultrasound on August 8 was all within normal limits.  Biophysicals on both babies were 8/8  and S/D ratio on Twin A was 3.01 with  the normal less than 3.5, and the MCA was 1.64 with the normal greater than  1.5; on Twin B the S/D ration was 2.24, also less than 3.5.  The MCA was  also not obtainable again today due to fetal positioning.  The plan  therefore is to discharge the patient to home.  She is to continue on  bedrest at home per Dr. Estanislado Pandy and to have weekly biophysical profiles and  Doppler studies at [facility].   DISCHARGE MEDICATIONS:  Prenatal vitamins daily.   DISCHARGE INSTRUCTIONS:  She is to call for decreased fetal movement,  uterine contractions or preterm labor, leaking, or bleeding.    DISCHARGE LABORATORY DATA:  She had a urinalysis that was negative.   FOLLOW-UP:  As previously  noted.      Concha Pyo. Duplantis, C.N.M.              Janine Limbo, M.D.    SJD/MEDQ  D:  06/05/2002  T:  06/09/2002  Job:  [id number]

## 2011-03-16 NOTE — H&P (Signed)
NAME:  Rebecca Watson, Rebecca Watson                         ACCOUNT NO.:  [id number]   MEDICAL RECORD NO.:  [id number]                   PATIENT TYPE:  OUT   LOCATION:  ULT                                  [facility]   PHYSICIAN:  Renaldo Reel. Emilee Hero, C.N.M.             DATE OF BIRTH:  11/20/1980   DATE OF ADMISSION:  06/04/2002  DATE OF DISCHARGE:                                HISTORY & PHYSICAL   HISTORY OF PRESENT ILLNESS:  The patient is a 30 year old gravida 1, para 0,  at 30 weeks with a twin gestation, who presented from the office for  monitoring secondary to the cervix closed and 50% and soft, with decreased  movement in twin A.  The patient also had complained of diarrhea for the  last two to three weeks; she denied any other symptoms.  She was evaluated  for questionable leaking at the office; negative fern, nitrazine and pooling  were noted.  Pregnancy has been remarkable for:  #1 - twin gestation, #2 -  obesity, #3 - previous smoker, #4 - questionable last menstrual period.  Upon evaluation in Maternity Admissions, the patient was found to be  contracting; she received one dose of terbutaline and subsequently was put  on Procardia when contractions resumed in a mild way.  She had an ultrasound  that showed abnormal Dopplers of the uterine artery in twin A, normal  Dopplers in twin B and normal growth in both fetuses with normal fluid; she  was therefore admitted for at least 23-hour observation for betamethasone  therapy and repeat Dopplers in the a.m.   PRENATAL LABORATORY DATA:  Blood type is O-positive, Rh-antibody negative.  VDRL nonreactive.  Rubella titer is positive.  Hepatitis B surface antigen  is negative.  HIV nonreactive.  Sickle cell test negative.  GC and Chlamydia  cultures were negative.  Pap was normal.  Glucose challenge was normal.  AFP  was normal.  Hemoglobin upon entry into practice was 12; it was 10.2 at 28  weeks.   HISTORY OF PRESENT PREGNANCY:  The  patient entered care at approximately 17  weeks; she was diagnosed with twins on ultrasound.  Cervix was 3.8 cm.  She  had another ultrasound at 26 weeks that showed both twins in a transverse  position with normal growth and normal placentas.  She had a Glucola that  was normal.  She had a followup ultrasound that was scheduled on the 14th of  August.  She was seen in the office today secondary to increased vaginal  wetness; she was evaluated for leaking, which was found to be negative.  Her  cervix was closed, 50% and soft and the presenting part was out of the  pelvis.  She also noted decreased fetal movement in what she described as  twin A, which was on the right-hand side.   OBSTETRICAL HISTORY:  The patient is a primigravida.  MEDICAL HISTORY:  She had Chlamydia when she was age 65.  She was a previous  smoker of one-half pack per day.   ALLERGIES:  She has no known medication allergies.   FAMILY HISTORY:  Her paternal uncle had a heart attack.  Maternal  grandmother, paternal grandmother, maternal grandfather and mother had  hypertension.  Paternal grandmother had varicosities and possible phlebitis.  Her sister had anemia.  Her mother had questionable sickle cell.  Her niece  had emphysema.  Her paternal aunt and cousin had asthma.  Maternal  grandmother had non-insulin-dependent diabetes; paternal grandmother had  insulin-dependent diabetes.  Paternal grandmother had questionable  rheumatoid arthritis.  Mother had leukemia and is now deceased; mother also  had a stroke and seizures.  The patient's father and paternal uncles are  alcohol users; paternal uncle is also a drug user.   GENETIC HISTORY:  Genetic history is remarkable for the father of the  infant's uncle having Down's syndrome, the patient's mother had questionable  sickle cell trait or disease, the patient's cousin had twins and the  patient's great aunt had two sets of twins.   SOCIAL HISTORY:  The patient is  single.  The father of the baby is involved  and supportive; his name is IT trainer.  The patient's is [place] and of the [place] faith.  She has her GED; she is unemployed.  Her partner has a ninth grade education; he is employed in Garment/textile technologist.  She has been followed by the physician service at [facility].  She  denies any alcohol, drug to tobacco use during this pregnancy.   PHYSICAL EXAMINATION:  VITAL SIGNS:  Stable.  The patient is afebrile.   HEENT:  Within normal limits.   LUNGS:  Bilateral breath sounds are clear.   HEART:  Regular rate and rhythm without murmur.   BREASTS:  Soft and nontender.   ABDOMEN:  Fundal height is approximately 38 cm.  Abdomen is soft and  nontender.  Uterine contractions are now irregular and mild after  terbutaline and initiation of Procardia.  Fetal heart rates are difficult to  trace due to the patient's OB habitus but they are currently reassuring when  they are able to be monitored.   EXTREMITIES:  Deep tendon reflexes are 2+ without clonus.  There is a trace  edema noted.   LABORATORY AND ACCESSORY CLINICAL DATA:  Ultrasound today shows twin A  inferior transverse position with the head on maternal right, estimated  fetal weight  1433 g to 1485 g, to the 50th to 75th percentile.  Fluid is  normal.  Placenta is posterior.  Cervix is 3.8 cm.  BPP was 8/8.  Uterine  artery systolic/diastolic ratio was 4.25, which is elevated.  Her mean  cerebral artery is 1.6, which is normal.  Twin B shows positioning in the  superior transverse position with head on the maternal right.  Estimated  fetal weight is 1465 g to 1531 g, which ranges anywhere from the 50th to  90th percentile.  Fluid is normal.  Placenta is anterior.  BPP was 6/8 with  no breathing movements.  Uterine artery systolic/diastolic ratio is 3.0,  which is normal.  Mean cerebral artery was unable to obtain.   IMPRESSION: 1. Intrauterine twin pregnancy at 30  weeks.  2. Slight cervical change.  3. Uterine contractions responsive to minimal tocolytics.  4. Abnormal umbilical artery Doppler flow on twin A.   PLAN:  1. Admit to  the [facility] for consult with Dr. Dois Davenport     A. Rivard as attending physician.  2. Bedrest with bathroom privileges.  3. Betamethasone 12.5 mg IM q.24h. x2 doses.  4.     Will plan repeat BPP and Dopplers in the a.m. on both twins.  5. M.D.s will continue to follow.  6. Please make a note, twin gestation is dichorionic-diamniotic.                                               Renaldo Reel Emilee Hero, C.N.M.    VLL/MEDQ  D:  06/05/2002  T:  06/05/2002  Job:  [id number]

## 2011-03-16 NOTE — H&P (Signed)
NAME:  Rebecca Watson, Rebecca Watson                         ACCOUNT NO.:  [id number]   MEDICAL RECORD NO.:  [id number]                   PATIENT TYPE:  INP   LOCATION:  9168                                 FACILITY:  [facility]   PHYSICIAN:  Concha Pyo. Duplantis, C.N.M.        DATE OF BIRTH:  Mar 17, 1981   DATE OF ADMISSION:  06/10/2002  DATE OF DISCHARGE:                                HISTORY & PHYSICAL   HISTORY OF PRESENT ILLNESS:  The patient is a 30 year old single black  female, gravida 1, para 0, at 69 and 5/7 weeks by ultrasound and LMP, who  presents with a twin gestation, complaining of decreased fetal movement of  both babies all day today.  She was returning from [place], where her  husband is employed, and came by the hospital on her way home.  She denies  any leaking or vaginal bleeding.  She denies any regular uterine  contractions.  She denies any nausea, vomiting, headaches or visual  disturbances.  She was previously admitted on June 04, 2002 with an  abnormal Doppler flow study on twin A but a repeat ultrasound on August 8th  showed all normal studies and biophysical was 8/8.  She was subsequently  discharged to home and has been recommended to be on bedrest and have  followup ultrasounds weekly.  Her pregnancy has been followed at [facility] by the M.D. service and has been at risk for:  #1 - a  questionable LMP, and #2 - twin gestation.   OBSTETRICAL/GYNECOLOGICAL HISTORY:  She is a primigravida with a  questionable LMP of November 07, 2001, giving her an [facility] of August 14, 2002  and by ultrasound, her EDG is August 12, 2002.   ALLERGIES:  She has no known drug allergies.   GENERAL MEDICAL HISTORY:  She reports having had the usual childhood  diseases.  She has no other medical problems.  She smoked prior to this  pregnancy.   FAMILY HISTORY:  Her family history is significant for paternal uncle with  MI, grandparents with chronic hypertension, paternal  grandmother with  thrombophlebitis, sister with anemia, mother with possible sickle cell  trait, niece with emphysema, maternal grandmother and paternal grandmother  with insulin-dependent diabetes, paternal grandmother with rheumatoid  arthritis, mother deceased from leukemia.   GENETIC HISTORY:  Her genetic history is significant for patient's mother  with sickle cell trait and cousin who delivered twins.   SOCIAL HISTORY:  She is single.  The father of the baby is Drucie Ip; he  is involved and supportive.  He is employed full-time.  She is a Engineer, petroleum.  They are of the [place] faith.  They deny any illicit drug  use, alcohol or smoking since knowing she was pregnant.   PRENATAL LABORATORY DATA:  Her blood type is O-positive, her antibody screen  is negative, sickle cell trait is negative, syphilis is nonreactive, rubella  is not currently  available, hepatitis B surface antigen is negative, HIV is  nonreactive, GC and Chlamydia are both negative, Pap was within normal  limits, her one-hour Glucola was 66 and the maternal serum alpha-fetoprotein  screening was within normal range.   PHYSICAL EXAMINATION:  VITAL SIGNS:  Her vital signs are stable and she is  afebrile.  HEENT:  Grossly within normal limits.  HEART:  Regular rhythm and rate.  CHEST:  Clear.  BREASTS:  Soft and nontender.  ABDOMEN:  Her abdomen is gravid.  Her fetal heart rate on baby A is reactive  and reassuring, on baby B, in the upper quadrant, is reassuring but not  strictly reactive and lots of fetal movement is audible.  Her uterine  contractions are about every 10 to 15 minutes and mild.  PELVIC:  Cervix is closed, thick and high and the presenting part is out of  the pelvis.  EXTREMITIES:  Within normal limits.   ASSESSMENT:  1. Intrauterine pregnancy at 30-5/7 weeks.  2. Nonreactive nonstress test on twin B.   PLAN:  Her plan per consult with Dr. Hal Morales is to admit for   observation and do ultrasound for AFI, Doppler studies and biophysical  profile on June 10, 2002 instead of June 11, 2002.                                                Concha Pyo. Duplantis, C.N.M.    SJD/MEDQ  D:  06/10/2002  T:  06/10/2002  Job:  [id number]

## 2011-07-19 LAB — URINALYSIS, ROUTINE W REFLEX MICROSCOPIC
Bilirubin Urine: NEGATIVE
Glucose, UA: NEGATIVE
Hgb urine dipstick: NEGATIVE
Ketones, ur: NEGATIVE
Nitrite: NEGATIVE
Protein, ur: NEGATIVE
Specific Gravity, Urine: 1.02
Urobilinogen, UA: 0.2
pH: 6.5

## 2011-07-19 LAB — CBC
HCT: 36.5
Hemoglobin: 11.9 — ABNORMAL LOW
MCHC: 32.6
MCV: 75 — ABNORMAL LOW
Platelets: 263
RBC: 4.86
RDW: 16.9 — ABNORMAL HIGH
WBC: 5.7

## 2011-07-19 LAB — HCG, SERUM, QUALITATIVE: Preg, Serum: NEGATIVE

## 2011-08-09 LAB — URINALYSIS, ROUTINE W REFLEX MICROSCOPIC
Bilirubin Urine: NEGATIVE
Glucose, UA: NEGATIVE
Ketones, ur: NEGATIVE
Leukocytes, UA: NEGATIVE
Nitrite: NEGATIVE
Protein, ur: NEGATIVE
Specific Gravity, Urine: 1.01
Urobilinogen, UA: 0.2
pH: 6

## 2011-08-09 LAB — URINE MICROSCOPIC-ADD ON

## 2011-08-09 LAB — POCT PREGNANCY, URINE
Operator id: 120561
Preg Test, Ur: NEGATIVE

## 2013-01-14 ENCOUNTER — Emergency Department (HOSPITAL_COMMUNITY)
Admission: EM | Admit: 2013-01-14 | Discharge: 2013-01-14 | Disposition: A | Discharge status: Home / Self Care | Payer: Self-pay | Attending: Emergency Medicine | Admitting: Emergency Medicine

## 2013-01-14 ENCOUNTER — Encounter (HOSPITAL_COMMUNITY): Payer: Self-pay

## 2013-01-14 DIAGNOSIS — N898 Other specified noninflammatory disorders of vagina: Secondary | ICD-10-CM | POA: Insufficient documentation

## 2013-01-14 DIAGNOSIS — R109 Unspecified abdominal pain: Secondary | ICD-10-CM

## 2013-01-14 DIAGNOSIS — Z8719 Personal history of other diseases of the digestive system: Secondary | ICD-10-CM | POA: Insufficient documentation

## 2013-01-14 DIAGNOSIS — K648 Other hemorrhoids: Secondary | ICD-10-CM | POA: Insufficient documentation

## 2013-01-14 DIAGNOSIS — K644 Residual hemorrhoidal skin tags: Secondary | ICD-10-CM | POA: Insufficient documentation

## 2013-01-14 DIAGNOSIS — F17200 Nicotine dependence, unspecified, uncomplicated: Secondary | ICD-10-CM | POA: Insufficient documentation

## 2013-01-14 DIAGNOSIS — R110 Nausea: Secondary | ICD-10-CM | POA: Insufficient documentation

## 2013-01-14 DIAGNOSIS — A5909 Other urogenital trichomoniasis: Secondary | ICD-10-CM | POA: Insufficient documentation

## 2013-01-14 DIAGNOSIS — IMO0002 Reserved for concepts with insufficient information to code with codable children: Secondary | ICD-10-CM | POA: Insufficient documentation

## 2013-01-14 DIAGNOSIS — Z7251 High risk heterosexual behavior: Secondary | ICD-10-CM | POA: Insufficient documentation

## 2013-01-14 DIAGNOSIS — Z3202 Encounter for pregnancy test, result negative: Secondary | ICD-10-CM | POA: Insufficient documentation

## 2013-01-14 HISTORY — DX: Diverticulitis of intestine, part unspecified, without perforation or abscess without bleeding: K57.92

## 2013-01-14 LAB — CBC WITH DIFFERENTIAL/PLATELET
Basophils Absolute: 0 10*3/uL (ref 0.0–0.1)
Basophils Relative: 0 % (ref 0–1)
Eosinophils Absolute: 0.1 10*3/uL (ref 0.0–0.7)
Eosinophils Relative: 2 % (ref 0–5)
HCT: 40 % (ref 36.0–46.0)
Hemoglobin: 12.9 g/dL (ref 12.0–15.0)
Lymphocytes Relative: 29 % (ref 12–46)
Lymphs Abs: 2 10*3/uL (ref 0.7–4.0)
MCH: 25.7 pg — ABNORMAL LOW (ref 26.0–34.0)
MCHC: 32.3 g/dL (ref 30.0–36.0)
MCV: 79.8 fL (ref 78.0–100.0)
Monocytes Absolute: 0.6 10*3/uL (ref 0.1–1.0)
Monocytes Relative: 8 % (ref 3–12)
Neutro Abs: 4.2 10*3/uL (ref 1.7–7.7)
Neutrophils Relative %: 61 % (ref 43–77)
Platelets: 258 10*3/uL (ref 150–400)
RBC: 5.01 MIL/uL (ref 3.87–5.11)
RDW: 14.2 % (ref 11.5–15.5)
WBC: 6.9 10*3/uL (ref 4.0–10.5)

## 2013-01-14 LAB — URINALYSIS, MICROSCOPIC ONLY
Bilirubin Urine: NEGATIVE
Glucose, UA: NEGATIVE mg/dL
Hgb urine dipstick: NEGATIVE
Ketones, ur: NEGATIVE mg/dL
Nitrite: NEGATIVE
Protein, ur: NEGATIVE mg/dL
Specific Gravity, Urine: 1.022 (ref 1.005–1.030)
Urobilinogen, UA: 1 mg/dL (ref 0.0–1.0)
pH: 6 (ref 5.0–8.0)

## 2013-01-14 LAB — OCCULT BLOOD, POC DEVICE: Fecal Occult Bld: POSITIVE — AB

## 2013-01-14 LAB — WET PREP, GENITAL
Clue Cells Wet Prep HPF POC: NONE SEEN
Yeast Wet Prep HPF POC: NONE SEEN

## 2013-01-14 LAB — BASIC METABOLIC PANEL
BUN: 5 mg/dL — ABNORMAL LOW (ref 6–23)
CO2: 23 mEq/L (ref 19–32)
Calcium: 9 mg/dL (ref 8.4–10.5)
Chloride: 101 mEq/L (ref 96–112)
Creatinine, Ser: 0.76 mg/dL (ref 0.50–1.10)
GFR calc Af Amer: >90 mL/min (ref >90)
GFR calc non Af Amer: >90 mL/min (ref >90)
Glucose, Bld: 77 mg/dL (ref 70–99)
Potassium: 3.5 mEq/L (ref 3.5–5.1)
Sodium: 135 mEq/L (ref 135–145)

## 2013-01-14 LAB — POCT PREGNANCY, URINE: Preg Test, Ur: NEGATIVE

## 2013-01-14 MED ORDER — LIDOCAINE HCL (PF) 1 % IJ SOLN
INTRAMUSCULAR | Status: AC
  Administered 2013-01-14: 1 mL
  Filled 2013-01-14: qty 5

## 2013-01-14 MED ORDER — AZITHROMYCIN 250 MG PO TABS
1000.0000 mg | ORAL_TABLET | Freq: Once | ORAL | Status: AC
  Administered 2013-01-14: 1000 mg via ORAL
  Filled 2013-01-14: qty 4

## 2013-01-14 MED ORDER — PROMETHAZINE HCL 25 MG PO TABS: 25.0000 mg | ORAL_TABLET | Freq: Four times a day (QID) | ORAL | Status: DC | PRN

## 2013-01-14 MED ORDER — CEFTRIAXONE SODIUM 250 MG IJ SOLR
250.0000 mg | Freq: Once | INTRAMUSCULAR | Status: AC
  Administered 2013-01-14: 250 mg via INTRAMUSCULAR
  Filled 2013-01-14: qty 250

## 2013-01-14 MED ORDER — DOXYCYCLINE HYCLATE 100 MG PO CAPS: 100.0000 mg | ORAL_CAPSULE | Freq: Two times a day (BID) | ORAL | Status: DC

## 2013-01-14 MED ORDER — HYDROCORTISONE 2.5 % RE CREA: TOPICAL_CREAM | RECTAL | Status: DC

## 2013-01-14 MED ORDER — METRONIDAZOLE 500 MG PO TABS: 500.0000 mg | ORAL_TABLET | Freq: Two times a day (BID) | ORAL | Status: DC

## 2013-01-14 MED ORDER — METRONIDAZOLE 500 MG PO TABS
500.0000 mg | ORAL_TABLET | Freq: Once | ORAL | Status: AC
  Administered 2013-01-14: 500 mg via ORAL
  Filled 2013-01-14: qty 1

## 2013-01-14 NOTE — ED Provider Notes (Signed)
History     CSN: [id number]  Arrival date & time 01/14/13  1322   First MD Initiated Contact with Patient 01/14/13 1539      Chief Complaint  Patient presents with  . Abdominal Pain    (Consider location/radiation/quality/duration/timing/severity/associated sxs/prior treatment) Patient is a 32 y.o. female presenting with abdominal pain. The history is provided by the patient and medical records. No language interpreter was used.  Abdominal Pain Associated symptoms: nausea   Associated symptoms: no chest pain, no constipation, no cough, no diarrhea, no dysuria, no fatigue, no fever, no hematuria, no shortness of breath, no vaginal bleeding and no vomiting     Rebecca Watson is a 32 y.o. female  with a hx of diverticulitis, hemorrhoids, constipation presents to the Emergency Department complaining of gradual, persistent, progressively worsening LLQ and suprapubic pain onset Feb 7th. Pt also started her menses at the same time.  Menses lasted to March 13th.  Pt with constipation like symptoms 3 days ago and treated with laxatives. 2 day ago pt with BRBPR; which she attributes to her hemorrhoids.  Pt denies vaginal discharge.  Associated symptoms include LLQ pain described as a constant aching but with intermittent cramping that resolves.  This pain initially felt like her menses, but her menses has concluded.  Nothing makes it better and sexual intercourse makes it worse.  Pt denies fever, chills, dysuria, hematuria.  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    No hx of SBO.     Past Medical History  Diagnosis Date  . Diverticulitis     History reviewed. No pertinent past surgical history.  No family history on file.  History  Substance Use Topics  . Smoking status: Current Every Day Smoker  . Smokeless tobacco: Not on file  . Alcohol Use: No    OB History   Grav Para Term Preterm Abortions TAB SAB Ect Mult Living                  Review  of Systems  Constitutional: Negative for fever, diaphoresis, appetite change, fatigue and unexpected weight change.  HENT: Negative for mouth sores, trouble swallowing, neck pain and neck stiffness.   Respiratory: Negative for cough, chest tightness, shortness of breath, wheezing and stridor.   Cardiovascular: Negative for chest pain and palpitations.  Gastrointestinal: Positive for nausea and abdominal pain. Negative for vomiting, diarrhea, constipation, blood in stool, abdominal distention and rectal pain.  Genitourinary: Positive for dyspareunia. Negative for dysuria, urgency, frequency, hematuria, flank pain, decreased urine volume, vaginal bleeding, difficulty urinating and pelvic pain.  Musculoskeletal: Negative for back pain.  Skin: Negative for rash.  Neurological: Negative for weakness.  Hematological: Negative for adenopathy.  Psychiatric/Behavioral: Negative for confusion.  All other systems reviewed and are negative.    Allergies  Review of patient's allergies indicates no known allergies.  Home Medications   Current Outpatient Rx  Name  Route  Sig  Dispense  Refill  . bisacodyl (DULCOLAX) 5 MG EC tablet   Oral   Take 10 mg by mouth 2 (two) times daily as needed for constipation.         Marland Kitchen ibuprofen (ADVIL,MOTRIN) 200 MG tablet   Oral   Take 400 mg by mouth every 8 (eight) hours as needed for pain.         . polyethylene glycol (MIRALAX / GLYCOLAX) packet   Oral   Take 17 g by mouth 2 (two) times daily.         Marland Kitchen  hydrocortisone (ANUSOL-HC) 2.5 % rectal cream      Apply rectally 2 times daily   30 g   1   . metroNIDAZOLE (FLAGYL) 500 MG tablet   Oral   Take 1 tablet (500 mg total) by mouth 2 (two) times daily. One po bid x 7 days   14 tablet   0   . promethazine (PHENERGAN) 25 MG tablet   Oral   Take 1 tablet (25 mg total) by mouth every 6 (six) hours as needed for nausea.   12 tablet   0     BP 107/56  Pulse 62  Temp(Src) 98 F (36.7 C)  (Oral)  Resp 18  SpO2 100%  LMP 01/02/2013  Physical Exam  Nursing note and vitals reviewed. Constitutional: She is oriented to person, place, and time. She appears well-developed and well-nourished.  HENT:  Head: Normocephalic and atraumatic.  Mouth/Throat: Oropharynx is clear and moist.  Eyes: Conjunctivae and EOM are normal. Pupils are equal, round, and reactive to light. No scleral icterus.  Neck: Normal range of motion.  Cardiovascular: Normal rate, regular rhythm, normal heart sounds and intact distal pulses.  Exam reveals no gallop.   No murmur heard. Pulmonary/Chest: Effort normal and breath sounds normal. She exhibits no tenderness.  Abdominal: Soft. Bowel sounds are normal. She exhibits no distension and no mass. There is no hepatosplenomegaly. There is tenderness in the suprapubic area and left lower quadrant. There is guarding. There is no rigidity, no rebound, no CVA tenderness, no tenderness at McBurney's point and negative Murphy's sign. Hernia confirmed negative in the right inguinal area and confirmed negative in the left inguinal area.  Genitourinary: Rectal exam shows external hemorrhoid, internal hemorrhoid and tenderness. Rectal exam shows no fissure, no mass and anal tone normal. Guaiac positive stool. No labial fusion. There is no rash, tenderness, lesion or injury on the right labia. There is no rash, tenderness, lesion or injury on the left labia. Uterus is not deviated, not enlarged, not fixed and not tender. Cervix exhibits motion tenderness (mild). Cervix exhibits no discharge and no friability. Right adnexum displays no mass, no tenderness and no fullness. Left adnexum displays no mass, no tenderness and no fullness. No erythema, tenderness or bleeding around the vagina. No foreign body around the vagina. No signs of injury around the vagina. Vaginal discharge (white, frothy discharge in the vaginal vault) found.  Musculoskeletal: She exhibits no tenderness.   Lymphadenopathy:    She has no cervical adenopathy.       Right: No inguinal adenopathy present.       Left: No inguinal adenopathy present.  Neurological: She is alert and oriented to person, place, and time. She exhibits normal muscle tone. Coordination normal.  Skin: Skin is warm and dry. No rash noted. No erythema.  Psychiatric: She has a normal mood and affect.    ED Course  Procedures (including critical care time)  Labs Reviewed  WET PREP, GENITAL - Abnormal; Notable for the following:    Trich, Wet Prep FEW (*)    WBC, Wet Prep HPF POC FEW (*)    All other components within normal limits  URINALYSIS, MICROSCOPIC ONLY - Abnormal; Notable for the following:    APPearance HAZY (*)    Leukocytes, UA SMALL (*)    Bacteria, UA FEW (*)    Squamous Epithelial / LPF MANY (*)    All other components within normal limits  CBC WITH DIFFERENTIAL - Abnormal; Notable for the following:  MCH 25.7 (*)    All other components within normal limits  BASIC METABOLIC PANEL - Abnormal; Notable for the following:    BUN 5 (*)    All other components within normal limits  OCCULT BLOOD, POC DEVICE - Abnormal; Notable for the following:    Fecal Occult Bld POSITIVE (*)    All other components within normal limits  GC/CHLAMYDIA PROBE AMP  URINE CULTURE  POCT PREGNANCY, URINE   No results found.   1. Trichomonal cervicitis   2. Abdominal pain   3. Internal and external hemorrhoids without complication   4. H/O diverticulitis of colon       MDM  Suann Larry presents with abdominal pain.  Patient is nontoxic, nonseptic appearing, in no apparent distress.  Patient's pain and other symptoms adequately managed in emergency department.  Labs and vitals reviewed.  Patient does not meet the SIRS or Sepsis criteria.  On repeat exam patient does not have a surgical abdomin and there are nor peritoneal signs.  No indication of appendicitis, bowel obstruction, bowel perforation, cholecystitis,  ectopic pregnancy.  Pt with positive Trich, and WBCs.  Pt has been treated with flagyl and prophylacticly with azithromycin and rocephin due to pts history, pelvic exam, and wet prep with increased WBCs. Pt with potentially early PID but without adnexal tenderness, afebrile, and hemodynamically stable.  Patient to be discharged with instructions to follow up with OBGYN. Discussed importance of using protection when sexually active. Pt understands that they have GC/Chlamydia cultures pending and that they will need to inform all sexual partners if results return positive.  Pt BRBPR is from her hemorrhoids.          Dierdre Forth, PA-C 01/14/13 1856  Dahlia Client Sherilee Smotherman, PA-C 01/14/13 1922

## 2013-01-14 NOTE — ED Notes (Signed)
Pelvic cart set up at bedside  

## 2013-01-14 NOTE — ED Notes (Signed)
Lower pelvic pain began the 7th and then pt. Had a light menses and it stopped on the 13th, an   the pain increased.  Pt. Denies any vaginal discharge,.  Is taking stool softeners,  Thought she was constipated but is moving her bowels everyday.  Now she has bright red blood in her stool, Intermittent. Denies any urinary problems

## 2013-01-14 NOTE — ED Notes (Signed)
Chaperone PA with Pelvic and hemoccult specimen collection

## 2013-01-15 LAB — URINE CULTURE: Colony Count: 5000

## 2013-01-15 LAB — GC/CHLAMYDIA PROBE AMP
CT Probe RNA: NEGATIVE
GC Probe RNA: NEGATIVE

## 2013-01-15 NOTE — ED Provider Notes (Signed)
Medical screening examination/treatment/procedure(s) were performed by non-physician practitioner and as supervising physician I was immediately available for consultation/collaboration.  Leilanee Righetti, MD 01/15/13 1419 

## 2015-01-05 ENCOUNTER — Encounter (HOSPITAL_COMMUNITY): Payer: Self-pay | Admitting: Emergency Medicine

## 2015-01-05 ENCOUNTER — Emergency Department (HOSPITAL_COMMUNITY)
Admission: EM | Admit: 2015-01-05 | Discharge: 2015-01-05 | Disposition: A | Discharge status: Home / Self Care | Payer: Self-pay | Attending: Emergency Medicine | Admitting: Emergency Medicine

## 2015-01-05 DIAGNOSIS — Z79899 Other long term (current) drug therapy: Secondary | ICD-10-CM | POA: Insufficient documentation

## 2015-01-05 DIAGNOSIS — IMO0001 Reserved for inherently not codable concepts without codable children: Secondary | ICD-10-CM

## 2015-01-05 DIAGNOSIS — Z8719 Personal history of other diseases of the digestive system: Secondary | ICD-10-CM | POA: Insufficient documentation

## 2015-01-05 DIAGNOSIS — Z720 Tobacco use: Secondary | ICD-10-CM | POA: Insufficient documentation

## 2015-01-05 DIAGNOSIS — Z7952 Long term (current) use of systemic steroids: Secondary | ICD-10-CM | POA: Insufficient documentation

## 2015-01-05 DIAGNOSIS — L03011 Cellulitis of right finger: Secondary | ICD-10-CM | POA: Insufficient documentation

## 2015-01-05 MED ORDER — SULFAMETHOXAZOLE-TRIMETHOPRIM 800-160 MG PO TABS: 1.0000 | ORAL_TABLET | Freq: Two times a day (BID) | ORAL | Status: DC

## 2015-01-05 NOTE — ED Provider Notes (Signed)
CSN: [id number]     Arrival date & time 01/05/15  0134 History  This chart was scribed for Rolland Porter, MD by Rayfield Citizen, ED Scribe. This patient was seen in room A05C/A05C and the patient's care was started at 2:39 AM.    Chief Complaint  Patient presents with  . Finger Injury   The history is provided by the patient. No language interpreter was used.     HPI Comments: Rebecca Watson is an otherwise healthy, right-hand dominant 34 y.o. female who presents to the Emergency Department complaining of swelling to her right middle finger beginning 12/30/14 with associated "throbbing" pain. She denies any prior trauma or injury to the finger; she does note that she recently pulled a hangnail from that finger on 12/30/14. She has tried ice, ibuprofen, and elevation at home without relief.   She is a .5ppd smoker.   No PCP at this time.   Past Medical History  Diagnosis Date  . Diverticulitis    History reviewed. No pertinent past surgical history. No family history on file. History  Substance Use Topics  . Smoking status: Current Every Day Smoker  . Smokeless tobacco: Not on file  . Alcohol Use: No   Smokes 1/2 ppd employed  OB History    No data available     Review of Systems  Skin: Positive for wound.  All other systems reviewed and are negative.  Allergies  Review of patient's allergies indicates no known allergies.  Home Medications   Prior to Admission medications   Medication Sig Start Date End Date Taking? Authorizing Provider  bisacodyl (DULCOLAX) 5 MG EC tablet Take 10 mg by mouth 2 (two) times daily as needed for constipation.    Historical Provider, MD  doxycycline (VIBRAMYCIN) 100 MG capsule Take 1 capsule (100 mg total) by mouth 2 (two) times daily. One po bid x 7 days 01/14/13   Jarrett Soho Muthersbaugh, PA-C  hydrocortisone (ANUSOL-HC) 2.5 % rectal cream Apply rectally 2 times daily 01/14/13   Jarrett Soho Muthersbaugh, PA-C  ibuprofen (ADVIL,MOTRIN) 200 MG tablet Take 400  mg by mouth every 8 (eight) hours as needed for pain.    Historical Provider, MD  metroNIDAZOLE (FLAGYL) 500 MG tablet Take 1 tablet (500 mg total) by mouth 2 (two) times daily. One po bid x 7 days 01/14/13   Jarrett Soho Muthersbaugh, PA-C  polyethylene glycol (MIRALAX / GLYCOLAX) packet Take 17 g by mouth 2 (two) times daily.    Historical Provider, MD  promethazine (PHENERGAN) 25 MG tablet Take 1 tablet (25 mg total) by mouth every 6 (six) hours as needed for nausea. 01/14/13   Hannah Muthersbaugh, PA-C   Denies taking any medications.  BP 139/75 mmHg  Pulse 89  Temp(Src) 97.7 F (36.5 C) (Oral)  Resp 16  SpO2 99%  Vital signs normal   Physical Exam  Constitutional: She is oriented to person, place, and time. She appears well-developed and well-nourished.  Non-toxic appearance. She does not appear ill. No distress.  HENT:  Head: Normocephalic and atraumatic.  Right Ear: External ear normal.  Left Ear: External ear normal.  Nose: Nose normal. No mucosal edema or rhinorrhea.  Mouth/Throat: Mucous membranes are normal. No dental abscesses or uvula swelling.  Eyes: Conjunctivae and EOM are normal.  Neck: Normal range of motion and full passive range of motion without pain. Neck supple.  Cardiovascular: Exam reveals no friction rub.   Pulmonary/Chest: Effort normal. No respiratory distress. She has no rhonchi. She exhibits no crepitus.  Abdominal: Normal appearance.  Musculoskeletal: Normal range of motion. She exhibits edema and tenderness.  Moves all extremities well.  Patient has mild swelling around the cuticle of her right middle finger that is more prominent ulnarly, there is no obvious purulence underneath the skin.  Neurological: She is alert and oriented to person, place, and time. She has normal strength. No cranial nerve deficit.  Skin: Skin is warm, dry and intact. No rash noted. No erythema. No pallor.  Psychiatric: She has a normal mood and affect. Her speech is normal and  behavior is normal. Her mood appears not anxious.  Nursing note and vitals reviewed.   ED Course  Procedures   DIAGNOSTIC STUDIES: Oxygen Saturation is 99% on RA, normal by my interpretation.    COORDINATION OF CARE: 2:46 AM Discussed treatment plan with pt at bedside and pt agreed to plan.  Patient's finger was swabbed with a alcohol prep pad. An 18-gauge needle was used to make a space between her cuticle in her fingernail of her right middle finger with release of purulent material. Patient's finger was soaked afterwards Betadine and peroxide.   patient refused pain medicine. She states she has Motrin to take at home.    Labs Review Labs Reviewed - No data to display  Imaging Review No results found.   EKG Interpretation None      MDM   Final diagnoses:  Paronychia of third finger, right   Discharge Medication List as of 01/05/2015  3:35 AM    Septra DS BID x 10 days  Rolland Porter, MD, FACEP   I personally performed the services described in this documentation, which was scribed in my presence. The recorded information has been reviewed and considered.  Rolland Porter, MD, Barbette Or, MD 01/05/15 [phone]

## 2015-01-05 NOTE — ED Notes (Signed)
Pt presents with right middle finger swelling and redness after pulling a hangnail off.  Denies injury.

## 2015-01-05 NOTE — ED Notes (Signed)
Pt c/o R middle finger swelling x 2 days. Reports pulling a hangnail for finger on Thursday. Has tried ice, ibuprofen, and elevation at home without relief. CMS intact

## 2015-01-05 NOTE — ED Notes (Signed)
Pt soaking finger in peroxide and betadine solution

## 2015-01-05 NOTE — Discharge Instructions (Signed)
Soak your finger in warm epsom salts 3-4 times a day until the swelling is gone. Take the antibiotic until gone. Take ibuprofen 600 mg 4 times a day for pain as needed. Recheck if you get worsening swelling or pain in your finger.  Paronychia Paronychia is an inflammatory reaction involving the folds of the skin surrounding the fingernail. This is commonly caused by an infection in the skin around a nail. The most common cause of paronychia is frequent wetting of the hands (as seen with bartenders, food servers, nurses or others who wet their hands). This makes the skin around the fingernail susceptible to infection by bacteria (germs) or fungus. Other predisposing factors are:  Aggressive manicuring.  Nail biting.  Thumb sucking. The most common cause is a staphylococcal (a type of germ) infection, or a fungal (Candida) infection. When caused by a germ, it usually comes on suddenly with redness, swelling, pus and is often painful. It may get under the nail and form an abscess (collection of pus), or form an abscess around the nail. If the nail itself is infected with a fungus, the treatment is usually prolonged and may require oral medicine for up to one year. Your caregiver will determine the length of time treatment is required. The paronychia caused by bacteria (germs) may largely be avoided by not pulling on hangnails or picking at cuticles. When the infection occurs at the tips of the finger it is called felon. When the cause of paronychia is from the herpes simplex virus (HSV) it is called herpetic whitlow. TREATMENT  When an abscess is present treatment is often incision and drainage. This means that the abscess must be cut open so the pus can get out. When this is done, the following home care instructions should be followed. HOME CARE INSTRUCTIONS   It is important to keep the affected fingers very dry. Rubber or plastic gloves over cotton gloves should be used whenever the hand must be  placed in water.  Keep wound clean, dry and dressed as suggested by your caregiver between warm soaks or warm compresses.  Soak in warm water for fifteen to twenty minutes three to four times per day for bacterial infections. Fungal infections are very difficult to treat, so often require treatment for long periods of time.  For bacterial (germ) infections take antibiotics (medicine which kill germs) as directed and finish the prescription, even if the problem appears to be solved before the medicine is gone.  Only take over-the-counter or prescription medicines for pain, discomfort, or fever as directed by your caregiver. SEEK IMMEDIATE MEDICAL CARE IF:  You have redness, swelling, or increasing pain in the wound.  You notice pus coming from the wound.  You have a fever.  You notice a bad smell coming from the wound or dressing. Document Released: 04/10/2001 Document Revised: 01/07/2012 Document Reviewed: 12/10/2008 [facility] Patient Information 2015 [place], [place]. This information is not intended to replace advice given to you by your health care provider. Make sure you discuss any questions you have with your health care provider.

## 2015-01-05 NOTE — ED Notes (Signed)
Pt verbalized understanding of d/c instructions and prescriptions and no further questions.

## 2015-03-03 ENCOUNTER — Telehealth: Payer: Self-pay | Admitting: Adult Health

## 2015-03-03 NOTE — Telephone Encounter (Signed)
Message created on wrong patient.

## 2015-09-14 ENCOUNTER — Emergency Department (HOSPITAL_COMMUNITY)
Admission: EM | Admit: 2015-09-14 | Discharge: 2015-09-14 | Disposition: A | Discharge status: Home / Self Care | Payer: No Typology Code available for payment source | Attending: Emergency Medicine | Admitting: Emergency Medicine

## 2015-09-14 ENCOUNTER — Encounter (HOSPITAL_COMMUNITY): Payer: Self-pay | Admitting: *Deleted

## 2015-09-14 DIAGNOSIS — S299XXA Unspecified injury of thorax, initial encounter: Secondary | ICD-10-CM | POA: Diagnosis present

## 2015-09-14 DIAGNOSIS — Z8719 Personal history of other diseases of the digestive system: Secondary | ICD-10-CM | POA: Insufficient documentation

## 2015-09-14 DIAGNOSIS — M62838 Other muscle spasm: Secondary | ICD-10-CM

## 2015-09-14 DIAGNOSIS — Y9289 Other specified places as the place of occurrence of the external cause: Secondary | ICD-10-CM | POA: Insufficient documentation

## 2015-09-14 DIAGNOSIS — Y92410 Unspecified street and highway as the place of occurrence of the external cause: Secondary | ICD-10-CM | POA: Diagnosis not present

## 2015-09-14 DIAGNOSIS — Y998 Other external cause status: Secondary | ICD-10-CM | POA: Diagnosis not present

## 2015-09-14 DIAGNOSIS — S4992XA Unspecified injury of left shoulder and upper arm, initial encounter: Secondary | ICD-10-CM | POA: Diagnosis not present

## 2015-09-14 DIAGNOSIS — F17200 Nicotine dependence, unspecified, uncomplicated: Secondary | ICD-10-CM | POA: Insufficient documentation

## 2015-09-14 DIAGNOSIS — Y9389 Activity, other specified: Secondary | ICD-10-CM | POA: Diagnosis not present

## 2015-09-14 DIAGNOSIS — S24109A Unspecified injury at unspecified level of thoracic spinal cord, initial encounter: Secondary | ICD-10-CM | POA: Insufficient documentation

## 2015-09-14 MED ORDER — CYCLOBENZAPRINE HCL 10 MG PO TABS: 10.0000 mg | ORAL_TABLET | Freq: Two times a day (BID) | ORAL | Status: DC | PRN

## 2015-09-14 MED ORDER — IBUPROFEN 800 MG PO TABS: 800.0000 mg | ORAL_TABLET | Freq: Three times a day (TID) | ORAL | Status: DC

## 2015-09-14 MED ORDER — ALBUTEROL SULFATE HFA 108 (90 BASE) MCG/ACT IN AERS
2.0000 | INHALATION_SPRAY | Freq: Once | RESPIRATORY_TRACT | Status: AC
  Administered 2015-09-14: 2 via RESPIRATORY_TRACT
  Filled 2015-09-14: qty 6.7

## 2015-09-14 NOTE — Discharge Instructions (Signed)
Take your medications as prescribed as needed for pain relief. You may also use heat for pain relief. Please follow up with a primary care provider from the Resource Guide provided below in 1 week. Please return to the Emergency Department if symptoms worsen or new onset of fever, numbness, tingling, weakness, difficulty breathing, chest pain.    Emergency Department Resource Guide 1) Find a Doctor and Pay Out of Pocket Although you won't have to find out who is covered by your insurance plan, it is a good idea to ask around and get recommendations. You will then need to call the office and see if the doctor you have chosen will accept you as a new patient and what types of options they offer for patients who are self-pay. Some doctors offer discounts or will set up payment plans for their patients who do not have insurance, but you will need to ask so you aren't surprised when you get to your appointment.  2) Contact Your Local Health Department Not all health departments have doctors that can see patients for sick visits, but many do, so it is worth a call to see if yours does. If you don't know where your local health department is, you can check in your phone book. The CDC also has a tool to help you locate your state's health department, and many state websites also have listings of all of their local health departments.  3) Find a [facility] If your illness is not likely to be very severe or complicated, you may want to try a walk in clinic. These are popping up all over the country in pharmacies, drugstores, and shopping centers. They're usually staffed by nurse practitioners or physician assistants that have been trained to treat common illnesses and complaints. They're usually fairly quick and inexpensive. However, if you have serious medical issues or chronic medical problems, these are probably not your best option.  No Primary Care Doctor: - Call Health Connect at  [phone] - they can  help you locate a primary care doctor that  accepts your insurance, provides certain services, etc. - Physician Referral Service- [phone]  Chronic Pain Problems: Organization         Address  Phone   Notes  [facility]  [phone] Patients need to be referred by their primary care doctor.   Medication Assistance: Organization         Address  Phone   Notes  [facility] Medication [facility] [address] [place], [place] [address] [phone] --Must be a resident of [facility] -- Must have NO insurance coverage whatsoever (no Medicaid/ Medicare, etc.) -- The pt. MUST have a primary care doctor that directs their care regularly and follows them in the community   MedAssist  [phone]   [employer]  [phone]    Agencies that provide inexpensive medical care: Organization         Address  Phone   Notes  [place]  [phone]   [facility]    [phone]   [facility] [place],  [phone]   [place] [address] [place] [phone]   Planned Parenthood    [phone]   [facility]    [phone]   [place] and [address], [place] Phone:  [phone], Fax:  [phone] Hours of Operation:  9 am - 6 pm, M-F.  Also accepts Medicaid/Medicare and self-pay.  [facility] for [place], [address], [place] Phone: [phone], Fax: [phone]. Hours of Operation:  8:30 am - 5:30 pm, M-F.  Also accepts Medicaid and self-pay.  [facility] [address], [place] Phone: [phone]   [place], [place], [place] [phone], Ext. [phone] Mondays & Thursdays: 7-9 AM.  First 15 patients are seen on a first come, first serve basis.    [place] Providers:  Organization         Address  Phone   Notes  [facility] [address], [place] [phone] Also accepts self-pay patients.  [facility] [address], [place]  [phone]   [place], [address], [place] [phone]   [facility] [address], [place] [phone]   Lucianne Lei [address], [place]   [phone] Only accepts [place] Access [place] patients after they have their name applied to their card.   Self-Pay (no insurance) in [facility]:  Organization         Address  Phone   Notes  Sickle Cell Patients, [facility] Internal Medicine [place] [phone]   [facility] [place] [phone]   [facility] [place]  [place], [place], [place] [phone]   [facility]/Dr. Osei-Bonsu  [address], [place] or [address], [place] [phone] Phone number for both [place] and [place] locations is the same.  Urgent Medical and [facility] [address], [place] [phone]   [facility] [address] [place] or [address] [phone]   [facility] [address] [place] [phone], phone; [phone], fax Sees patients 1st and 3rd Saturday of every month.  Must not qualify for public or private insurance (i.e. Medicaid, Medicare, [place] Health Choice, Veterans' Benefits)  Household income should be no more than 200% of the poverty level The clinic cannot treat you if you are pregnant or think you are pregnant  Sexually transmitted diseases are not treated at the clinic.    Dental Care: Organization         Address  Phone  Notes  [facility] Department of [facility] [place] [phone] Accepts children up to age 28 who are enrolled in [place] or [place]; pregnant women with a Medicaid card; and children who have applied for Medicaid or [place] Health Choice, but were declined, whose parents can pay a reduced fee at time of service.  [facility] Department of [facility]  [address], [place] [phone] Accepts children up to age 84 who are enrolled in [place] or [place]; pregnant women with a Medicaid card; and children who have applied for Medicaid or [place] Health Choice, but were declined, whose parents can pay a reduced fee at time of service.  [facility] Access PRO[facility]  [place] [phone] Patients are seen by appointment only. Walk-ins are not accepted. [place]  Dental will see patients 75 years of age and older. Monday - Tuesday (8am-5pm) Most Wednesdays (8:30-5pm) $30 per visit, cash only  [facility] Adult Dental Access PROGRAM  [address], [facility] [phone] Patients are seen by appointment only. Walk-ins are not accepted. [place] will see patients 30 years of age and older. One Wednesday Evening (Monthly: Volunteer Based).  $30 per visit, cash only  [place]  [phone] for adults; Children under age 14, call Graduate Pediatric Dentistry at [phone]. Children aged 46-14, please call [phone] to request a pediatric application.  Dental services are provided in all areas of dental care including fillings, crowns and bridges, complete and partial dentures, implants, gum treatment, root canals, and extractions. Preventive care is also provided. Treatment is provided to both adults and children. Patients are selected via a lottery and there is often a waiting list.   [facility] [address] [place]  [phone] [email or web address]   [facility] [address] [place], [place] [phone], Ext. [phone] Second and Fourth Thursday of each month, opens at 6:30 AM; Clinic ends at 9 AM.  Patients are seen on a first-come first-served basis, and a limited number are seen during each clinic.   [facility]  [address] [place] D[place], [place] [phone]   Eligibility Requirements You must have lived in [place], [place], or [place] counties for at least the last three months.   You cannot be eligible for state or federal sponsored [employer], including [employer], [place], or [employer].   You generally cannot be eligible for healthcare insurance through your employer.    How to apply: Eligibility screenings are held every Tuesday and Wednesday afternoon from 1:00 pm until 4:00 pm. You do not need an appointment for the interview!  [facility] [address] [place], [place]   [facility]  [place] Department  [place]  [phone]    Behavioral Health Resources in the Community: Intensive Outpatient Programs Organization         Address  Phone  Notes  [place]. [address], [place], [place] [phone]   [facility] [address], [place], [place]   ADS: [facility] [address], [place], [place]   [place] [address]  [place], [place] or [phone]   Substance Abuse Resources Organization         Address  Phone  Notes  Alcohol and Drug Services  [phone]   [place]  [phone]   [facility] [place]   Chinita Pester  [phone]   Residential & Outpatient Substance Abuse Program  [phone]   Psychological Services Organization         Address  Phone  Notes  [facility] [place]  [place]  [phone]   [place] [address] [employer] or [phone]    Mobile Crisis Teams Organization         Address  Phone  Notes  [facility], Mobile Crisis Care Unit  [phone]   [facility]  [address] [place], [place]   [facility] [address] [place] [phone]    Self-Help/Support Groups Organization  Address  Phone             Notes  [place]. of [place] - variety of support groups  [place] Call for more information  Narcotics Anonymous (NA), [facility] [address], [employer] [place]  2 meetings at this location   Special educational needs teacher         Address  Phone  Notes  [facility] [place],    [place]  [phone]   [facility]  [address] [place] [phone], [place], [place]   Melissa [place], [place] [phone] Admissions: 8am-3pm M-F  Incentives Substance [place] [address],    [place], [place] [phone]   [facility] [address] [place], [place], [place]   [facility] Alejandro Otero Lopez [address]  [place], [place]   [facility] - Intensive Outpatient [address], [place], [place]   [facility] [address]  [place], [place] [phone] or [phone]   [facility] (RTS) [address] [place], [place] Accepts Medicaid  [facility] [place] [address]  [place] [phone] Substance Abuse/Addiction Treatment   [facility] Organization         Address  Phone  Notes  [facility]  [phone]   Domenic Schwab, PhD [address] Arlis Porta [place], [place]   [phone] or  [phone]   [place], [place] [phone]   [facility] [address] [place], [place] [phone] Insurance/Medicaid/sponsorship through [facility] and Families [address], [address] [place]                                    [place], [place] [phone] [place] [address], [place] [phone]    Dr. Adele Schilder  [phone]   [facility] Dept. 1) [address], [place] 2) [place] 3)  [place] [address], [place] [phone]  [phone]   [place] [phone] or [phone] (After Hours)
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
## 2016-04-19 DIAGNOSIS — N739 Female pelvic inflammatory disease, unspecified: Secondary | ICD-10-CM | POA: Insufficient documentation

## 2016-04-19 DIAGNOSIS — M545 Low back pain: Secondary | ICD-10-CM | POA: Insufficient documentation

## 2016-04-19 DIAGNOSIS — Z79899 Other long term (current) drug therapy: Secondary | ICD-10-CM | POA: Insufficient documentation

## 2016-04-19 DIAGNOSIS — F17200 Nicotine dependence, unspecified, uncomplicated: Secondary | ICD-10-CM | POA: Insufficient documentation

## 2016-04-20 ENCOUNTER — Encounter (HOSPITAL_COMMUNITY): Payer: Self-pay | Admitting: Emergency Medicine

## 2016-04-20 ENCOUNTER — Emergency Department (HOSPITAL_COMMUNITY)
Admission: EM | Admit: 2016-04-20 | Discharge: 2016-04-20 | Disposition: A | Discharge status: Home / Self Care | Payer: Self-pay | Attending: Emergency Medicine | Admitting: Emergency Medicine

## 2016-04-20 ENCOUNTER — Emergency Department (HOSPITAL_COMMUNITY): Payer: Self-pay

## 2016-04-20 DIAGNOSIS — R102 Pelvic and perineal pain: Secondary | ICD-10-CM

## 2016-04-20 DIAGNOSIS — N730 Acute parametritis and pelvic cellulitis: Secondary | ICD-10-CM

## 2016-04-20 HISTORY — DX: Diverticulosis of intestine, part unspecified, without perforation or abscess without bleeding: K57.90

## 2016-04-20 HISTORY — DX: Obesity, unspecified: E66.9

## 2016-04-20 LAB — WET PREP, GENITAL
Sperm: NONE SEEN
Trich, Wet Prep: NONE SEEN
Yeast Wet Prep HPF POC: NONE SEEN

## 2016-04-20 LAB — COMPREHENSIVE METABOLIC PANEL
ALT: 10 U/L — ABNORMAL LOW (ref 14–54)
AST: 16 U/L (ref 15–41)
Albumin: 3.9 g/dL (ref 3.5–5.0)
Alkaline Phosphatase: 66 U/L (ref 38–126)
Anion gap: 6 (ref 5–15)
BUN: 5 mg/dL — ABNORMAL LOW (ref 6–20)
CO2: 24 mmol/L (ref 22–32)
Calcium: 9 mg/dL (ref 8.9–10.3)
Chloride: 109 mmol/L (ref 101–111)
Creatinine, Ser: 0.99 mg/dL (ref 0.44–1.00)
GFR calc Af Amer: >60 mL/min (ref >60)
GFR calc non Af Amer: >60 mL/min (ref >60)
Glucose, Bld: 73 mg/dL (ref 65–99)
Potassium: 3.3 mmol/L — ABNORMAL LOW (ref 3.5–5.1)
Sodium: 139 mmol/L (ref 135–145)
Total Bilirubin: 0.4 mg/dL (ref 0.3–1.2)
Total Protein: 7.2 g/dL (ref 6.5–8.1)

## 2016-04-20 LAB — URINE MICROSCOPIC-ADD ON: RBC / HPF: NONE SEEN RBC/hpf (ref 0–5)

## 2016-04-20 LAB — CBC
HCT: 42.2 % (ref 36.0–46.0)
Hemoglobin: 13.4 g/dL (ref 12.0–15.0)
MCH: 25.8 pg — ABNORMAL LOW (ref 26.0–34.0)
MCHC: 31.8 g/dL (ref 30.0–36.0)
MCV: 81.3 fL (ref 78.0–100.0)
Platelets: 295 10*3/uL (ref 150–400)
RBC: 5.19 MIL/uL — ABNORMAL HIGH (ref 3.87–5.11)
RDW: 14.7 % (ref 11.5–15.5)
WBC: 7.1 10*3/uL (ref 4.0–10.5)

## 2016-04-20 LAB — URINALYSIS, ROUTINE W REFLEX MICROSCOPIC
Glucose, UA: NEGATIVE mg/dL
Hgb urine dipstick: NEGATIVE
Ketones, ur: 15 mg/dL — AB
Nitrite: NEGATIVE
Protein, ur: NEGATIVE mg/dL
Specific Gravity, Urine: 1.033 — ABNORMAL HIGH (ref 1.005–1.030)
pH: 6 (ref 5.0–8.0)

## 2016-04-20 LAB — RPR: RPR Ser Ql: NONREACTIVE

## 2016-04-20 LAB — POC URINE PREG, ED: Preg Test, Ur: NEGATIVE

## 2016-04-20 LAB — GC/CHLAMYDIA PROBE AMP (~~LOC~~) NOT AT ARMC
Chlamydia: NEGATIVE
Neisseria Gonorrhea: NEGATIVE

## 2016-04-20 LAB — LIPASE, BLOOD: Lipase: 19 U/L (ref 11–51)

## 2016-04-20 LAB — HIV ANTIBODY (ROUTINE TESTING W REFLEX): HIV Screen 4th Generation wRfx: NONREACTIVE

## 2016-04-20 MED ORDER — OXYCODONE-ACETAMINOPHEN 5-325 MG PO TABS
2.0000 | ORAL_TABLET | Freq: Once | ORAL | Status: DC
  Filled 2016-04-20: qty 2

## 2016-04-20 MED ORDER — CEFTRIAXONE SODIUM 250 MG IJ SOLR
250.0000 mg | Freq: Once | INTRAMUSCULAR | Status: AC
  Administered 2016-04-20: 250 mg via INTRAMUSCULAR
  Filled 2016-04-20: qty 250

## 2016-04-20 MED ORDER — LIDOCAINE HCL (PF) 1 % IJ SOLN
5.0000 mL | Freq: Once | INTRAMUSCULAR | Status: AC
  Administered 2016-04-20: 5 mL
  Filled 2016-04-20: qty 5

## 2016-04-20 MED ORDER — IBUPROFEN 400 MG PO TABS
600.0000 mg | ORAL_TABLET | Freq: Once | ORAL | Status: AC
  Administered 2016-04-20: 600 mg via ORAL
  Filled 2016-04-20: qty 1

## 2016-04-20 MED ORDER — DOXYCYCLINE HYCLATE 100 MG PO CAPS: 100.0000 mg | ORAL_CAPSULE | Freq: Two times a day (BID) | ORAL | Status: AC

## 2016-04-20 MED ORDER — METRONIDAZOLE 500 MG PO TABS: 500.0000 mg | ORAL_TABLET | Freq: Two times a day (BID) | ORAL | Status: DC

## 2016-04-20 MED ORDER — AZITHROMYCIN 250 MG PO TABS
1000.0000 mg | ORAL_TABLET | Freq: Once | ORAL | Status: AC
  Administered 2016-04-20: 1000 mg via ORAL
  Filled 2016-04-20: qty 4

## 2016-04-20 NOTE — ED Notes (Signed)
Provider at bedside

## 2016-04-20 NOTE — ED Notes (Signed)
Pt c/o nausea; graham crackers and ginger ale given

## 2016-04-20 NOTE — Discharge Instructions (Signed)
Pelvic Inflammatory Disease °Pelvic inflammatory disease (PID) refers to an infection in some or all of the female organs. The infection can be in the uterus, ovaries, fallopian tubes, or the surrounding tissues in the pelvis. PID can cause abdominal or pelvic pain that comes on suddenly (acute pelvic pain). PID is a serious infection because it can lead to lasting (chronic) pelvic pain or the inability to have children (infertility). °CAUSES °This condition is most often caused by an infection that is spread during sexual contact. However, the infection can also be caused by the normal bacteria that are found in the vaginal tissues if these bacteria travel upward into the reproductive organs. PID can also occur following: °· The birth of a baby. °· A miscarriage. °· An abortion. °· Major pelvic surgery. °· The use of an intrauterine device (IUD). °· A sexual assault. °RISK FACTORS °This condition is more likely to develop in women who: °· Are younger than 36 years of age. °· Are sexually active at a young age. °· Use nonbarrier contraception. °· Have multiple sexual partners. °· Have sex with someone who has symptoms of an STD (sexually transmitted disease). °· Use oral contraception. °At times, certain behaviors can also increase the possibility of getting PID, such as: °· Using a vaginal douche. °· Having an IUD in place. °SYMPTOMS °Symptoms of this condition include: °· Abdominal or pelvic pain. °· Fever. °· Chills. °· Abnormal vaginal discharge. °· Abnormal uterine bleeding. °· Unusual pain shortly after the end of a menstrual period. °· Painful urination. °· Pain with sexual intercourse. °· Nausea and vomiting. °DIAGNOSIS °To diagnose this condition, your health care provider will do a physical exam and take your medical history. A pelvic exam typically reveals great tenderness in the uterus and the surrounding pelvic tissues. You may also have tests, such as: °· Lab tests, including a pregnancy test, blood  tests, and urine test. °· Culture tests of the vagina and cervix to check for an STD. °· Ultrasound. °· A laparoscopic procedure to look inside the pelvis. °· Examining vaginal secretions under a microscope. °TREATMENT °Treatment for this condition may involve one or more approaches. °· Antibiotic medicines may be prescribed to be taken by mouth. °· Sexual partners may need to be treated if the infection is caused by an STD. °· For more severe cases, hospitalization may be needed to give antibiotics directly into a vein through an IV tube. °· Surgery may be needed if other treatments do not help, but this is rare. °It may take weeks until you are completely well. If you are diagnosed with PID, you should also be checked for human immunodeficiency virus (HIV). Your health care provider may test you for infection again 3 months after treatment. You should not have unprotected sex. °HOME CARE INSTRUCTIONS °· Take over-the-counter and prescription medicines only as told by your health care provider. °· If you were prescribed an antibiotic medicine, take it as told by your health care provider. Do not stop taking the antibiotic even if you start to feel better. °· Do not have sexual intercourse until treatment is completed or as told by your health care provider. If PID is confirmed, your recent sexual partners will need treatment, especially if you had unprotected sex. °· Keep all follow-up visits as told by your health care provider. This is important. °SEEK MEDICAL CARE IF: °· You have increased or abnormal vaginal discharge. °· Your pain does not improve. °· You vomit. °· You have a fever. °· You   cannot tolerate your medicines. °· Your partner has an STD. °· You have pain when you urinate. °SEEK IMMEDIATE MEDICAL CARE IF: °· You have increased abdominal or pelvic pain. °· You have chills. °· Your symptoms are not better in 72 hours even with treatment. °  °This information is not intended to replace advice given to  you by your health care provider. Make sure you discuss any questions you have with your health care provider. °  °Document Released: 10/15/2005 Document Revised: 07/06/2015 Document Reviewed: 11/22/2014 °Elsevier Interactive Patient Education ©2016 [employer] ° °Sexually Transmitted Disease °A sexually transmitted disease (STD) is a disease or infection that may be passed (transmitted) from person to person, usually during sexual activity. This may happen by way of saliva, semen, blood, vaginal mucus, or urine. Common STDs include: °· Gonorrhea. °· Chlamydia. °· Syphilis. °· HIV and AIDS. °· Genital herpes. °· Hepatitis B and C. °· Trichomonas. °· Human papillomavirus (HPV). °· Pubic lice. °· Scabies. °· Mites. °· Bacterial vaginosis. °WHAT ARE CAUSES OF STDs? °An STD may be caused by bacteria, a virus, or parasites. STDs are often transmitted during sexual activity if one person is infected. However, they may also be transmitted through nonsexual means. STDs may be transmitted after:  °· Sexual intercourse with an infected person. °· Sharing sex toys with an infected person. °· Sharing needles with an infected person or using unclean piercing or tattoo needles. °· Having intimate contact with the genitals, mouth, or rectal areas of an infected person. °· Exposure to infected fluids during birth. °WHAT ARE THE SIGNS AND SYMPTOMS OF STDs? °Different STDs have different symptoms. Some people may not have any symptoms. If symptoms are present, they may include: °· Painful or bloody urination. °· Pain in the pelvis, abdomen, vagina, anus, throat, or eyes. °· A skin rash, itching, or irritation. °· Growths, ulcerations, blisters, or sores in the genital and anal areas. °· Abnormal vaginal discharge with or without bad odor. °· Penile discharge in men. °· Fever. °· Pain or bleeding during sexual intercourse. °· Swollen glands in the groin area. °· Yellow skin and eyes (jaundice). This is seen with  hepatitis. °· Swollen testicles. °· Infertility. °· Sores and blisters in the mouth. °HOW ARE STDs DIAGNOSED? °To make a diagnosis, your health care provider may: °· Take a medical history. °· Perform a physical exam. °· Take a sample of any discharge to examine. °· Swab the throat, cervix, opening to the penis, rectum, or vagina for testing. °· Test a sample of your first morning urine. °· Perform blood tests. °· Perform a Pap test, if this applies. °· Perform a colposcopy. °· Perform a laparoscopy. °HOW ARE STDs TREATED? °Treatment depends on the STD. Some STDs may be treated but not cured. °· Chlamydia, gonorrhea, trichomonas, and syphilis can be cured with antibiotic medicine. °· Genital herpes, hepatitis, and HIV can be treated, but not cured, with prescribed medicines. The medicines lessen symptoms. °· Genital warts from HPV can be treated with medicine or by freezing, burning (electrocautery), or surgery. Warts may come back. °· HPV cannot be cured with medicine or surgery. However, abnormal areas may be removed from the cervix, vagina, or vulva. °· If your diagnosis is confirmed, your recent sexual partners need treatment. This is true even if they are symptom-free or have a negative culture or evaluation. They should not have sex until their health care providers say it is okay. °· Your health care provider may test you for infection again   3 months after treatment. °HOW CAN I REDUCE MY RISK OF GETTING AN STD? °Take these steps to reduce your risk of getting an STD: °· Use latex condoms, dental dams, and water-soluble lubricants during sexual activity. Do not use petroleum jelly or oils. °· Avoid having multiple sex partners. °· Do not have sex with someone who has other sex partners °· Do not have sex with anyone you do not know or who is at high risk for an STD. °· Avoid risky sex practices that can break your skin. °· Do not have sex if you have open sores on your mouth or skin. °· Avoid drinking too much  alcohol or taking illegal drugs. Alcohol and drugs can affect your judgment and put you in a vulnerable position. °· Avoid engaging in oral and anal sex acts. °· Get vaccinated for HPV and hepatitis. If you have not received these vaccines in the past, talk to your health care provider about whether one or both might be right for you. °· If you are at risk of being infected with HIV, it is recommended that you take a prescription medicine daily to prevent HIV infection. This is called pre-exposure prophylaxis (PrEP). You are considered at risk if: °¨ You are a man who has sex with other men (MSM). °¨ You are a heterosexual man or woman and are sexually active with more than one partner. °¨ You take drugs by injection. °¨ You are sexually active with a partner who has HIV. °· Talk with your health care provider about whether you are at high risk of being infected with HIV. If you choose to begin PrEP, you should first be tested for HIV. You should then be tested every 3 months for as long as you are taking PrEP. °WHAT SHOULD I DO IF I THINK I HAVE AN STD? °· See your health care provider. °· Tell your sexual partner(s). They should be tested and treated for any STDs. °· Do not have sex until your health care provider says it is okay. °WHEN SHOULD I GET IMMEDIATE MEDICAL CARE? °Contact your health care provider right away if:  °· You have severe abdominal pain. °· You are a man and notice swelling or pain in your testicles. °· You are a woman and notice swelling or pain in your vagina. °  °This information is not intended to replace advice given to you by your health care provider. Make sure you discuss any questions you have with your health care provider. °  °Document Released: 01/05/2003 Document Revised: 11/05/2014 Document Reviewed: 05/05/2013 °Elsevier Interactive Patient Education ©2016 Elsevier Inc. ° °

## 2016-04-20 NOTE — ED Provider Notes (Signed)
CSN: [id number]     Arrival date & time 04/19/16  2348 History   First MD Initiated Contact with Patient 04/20/16 0241     Chief Complaint  Patient presents with  . Abdominal Pain     (Consider location/radiation/quality/duration/timing/severity/associated sxs/prior Treatment) HPI  Patient is a 35 year old female presents emergency Department with left-sided abdominal pain and bilateral achy low back pain for 2 weeks which has gradually worsened, is intermittent, currently rated 5 out of 10.  Her pain is worse when she lays down in bed, also exacerbated with palpation.  She denies dysuria, hematuria, pelvic pressure. She reports intermittent vaginal discharge, new dyspareunia, recently sexually active with 2 partners without protection. LMP was 2 weeks ago.  She denies nausea, vomiting, diarrhea, fever, chills, sweats.    Past Medical History  Diagnosis Date  . Diverticulitis   . Diverticulitis   . Diverticulosis   . Obesity    Past Surgical History  Procedure Laterality Date  . Cesarean section    . Tubal ligation    . Tubal ligation    . Cesarean section     No family history on file. Social History  Substance Use Topics  . Smoking status: Current Every Day Smoker  . Smokeless tobacco: Never Used  . Alcohol Use: No   OB History    No data available     Review of Systems  All other systems reviewed and are negative.     Allergies  Review of patient's allergies indicates no known allergies.  Home Medications   Prior to Admission medications   Medication Sig Start Date End Date Taking? Authorizing Provider  cyclobenzaprine (FLEXERIL) 10 MG tablet Take 1 tablet (10 mg total) by mouth 2 (two) times daily as needed for muscle spasms. 09/14/15  Yes Nona Dell, PA-C  ibuprofen (ADVIL,MOTRIN) 800 MG tablet Take 1 tablet (800 mg total) by mouth 3 (three) times daily. Patient taking differently: Take 800 mg by mouth every 8 (eight) hours as needed for  moderate pain.  09/14/15  Yes Nona Dell, PA-C  doxycycline (VIBRAMYCIN) 100 MG capsule Take 1 capsule (100 mg total) by mouth 2 (two) times daily. 04/20/16 05/04/16  Delsa Grana, PA-C  metroNIDAZOLE (FLAGYL) 500 MG tablet Take 1 tablet (500 mg total) by mouth 2 (two) times daily. 04/20/16   Delsa Grana, PA-C   BP 128/82 mmHg  Pulse 70  Temp(Src) 97.9 F (36.6 C) (Oral)  Resp 16  Ht 5\' 5"  (1.651 m)  Wt 120.657 kg  BMI 44.26 kg/m2  SpO2 97%  LMP 04/07/2016 (Approximate) Physical Exam  Constitutional: She is oriented to person, place, and time. She appears well-developed and well-nourished. No distress.  HENT:  Head: Normocephalic and atraumatic.  Nose: Nose normal.  Mouth/Throat: Oropharynx is clear and moist. No oropharyngeal exudate.  Eyes: Conjunctivae and EOM are normal. Pupils are equal, round, and reactive to light. Right eye exhibits no discharge. Left eye exhibits no discharge. No scleral icterus.  Neck: Normal range of motion. No JVD present. No tracheal deviation present. No thyromegaly present.  Cardiovascular: Normal rate, regular rhythm, normal heart sounds and intact distal pulses.  Exam reveals no gallop and no friction rub.   No murmur heard. Pulmonary/Chest: Effort normal and breath sounds normal. No respiratory distress. She has no wheezes. She has no rales. She exhibits no tenderness.  Abdominal: Soft. Bowel sounds are normal. She exhibits no distension and no mass. There is no tenderness. There is no rebound and no guarding.  No CVA tenderness  Genitourinary: There is no rash, tenderness, lesion or injury on the right labia. There is no rash, tenderness, lesion or injury on the left labia. Uterus is tender. Uterus is not enlarged. Cervix exhibits motion tenderness, discharge and friability. Right adnexum displays tenderness. Right adnexum displays no mass and no fullness. Left adnexum displays tenderness. Left adnexum displays no mass and no fullness. No  erythema or tenderness in the vagina. No foreign body around the vagina. No signs of injury around the vagina. Vaginal discharge found.  Moderate vaginal discharge, thin, yellow, with streaks of blood, foul  Musculoskeletal: Normal range of motion. She exhibits no edema or tenderness.  Lymphadenopathy:    She has no cervical adenopathy.  Neurological: She is alert and oriented to person, place, and time. She has normal reflexes. No cranial nerve deficit. She exhibits normal muscle tone. Coordination normal.  Skin: Skin is warm and dry. No rash noted. She is not diaphoretic. No erythema. No pallor.  Psychiatric: She has a normal mood and affect. Her behavior is normal. Judgment and thought content normal.  Nursing note and vitals reviewed.   ED Course  Procedures (including critical care time) Labs Review Labs Reviewed  WET PREP, GENITAL - Abnormal; Notable for the following:    Clue Cells Wet Prep HPF POC PRESENT (*)    WBC, Wet Prep HPF POC MANY (*)    All other components within normal limits  COMPREHENSIVE METABOLIC PANEL - Abnormal; Notable for the following:    Potassium 3.3 (*)    BUN 5 (*)    ALT 10 (*)    All other components within normal limits  CBC - Abnormal; Notable for the following:    RBC 5.19 (*)    MCH 25.8 (*)    All other components within normal limits  URINALYSIS, ROUTINE W REFLEX MICROSCOPIC (NOT AT [facility]) - Abnormal; Notable for the following:    APPearance CLOUDY (*)    Specific Gravity, Urine 1.033 (*)    Bilirubin Urine SMALL (*)    Ketones, ur 15 (*)    Leukocytes, UA SMALL (*)    All other components within normal limits  URINE MICROSCOPIC-ADD ON - Abnormal; Notable for the following:    Squamous Epithelial / LPF 6-30 (*)    Bacteria, UA RARE (*)    All other components within normal limits  LIPASE, BLOOD  RPR  HIV ANTIBODY (ROUTINE TESTING)  POC URINE PREG, ED  GC/CHLAMYDIA PROBE AMP ([place]) NOT AT [facility]    Imaging Review US  Transvaginal Non-ob  04/20/2016  CLINICAL DATA:  35 year old female with left lower quadrant pelvic pain EXAM: TRANSABDOMINAL AND TRANSVAGINAL ULTRASOUND OF PELVIS TECHNIQUE: Both transabdominal and transvaginal ultrasound examinations of the pelvis were performed. Transabdominal technique was performed for global imaging of the pelvis including uterus, ovaries, adnexal regions, and pelvic cul-de-sac. It was necessary to proceed with endovaginal exam following the transabdominal exam to visualize the endometrium and the ovaries. COMPARISON:  CT dated 04/25/2010 FINDINGS: Uterus The uterus is anteverted and heterogeneous measuring 9.7 x 5 3 x 5 7 cm. There is a 1.7 x 1.5 x 1.8 cm anterior body mural fibroid and a 1.7 x 2.1 x 1.8 cm posterior frontal sub serosal fibroid. Endometrium Thickness: 10 mm.  No focal abnormality visualized. Right ovary Measurements: 2.7 x 1.6 x 1.8 cm. Normal appearance/no adnexal mass. Left ovary Measurements: 3.4 x 1.7 x 2.8 cm. There is a 2.0 x 1.5 x 1.8 cm dominant follicle/involuting  cyst in the left ovary. Other findings No abnormal free fluid. IMPRESSION: A 2 cm left ovarian dominant follicle/cyst. Two small uterine fibroids. Electronically Signed   By: Anner Crete M.D.   On: 04/20/2016 06:27   US Pelvis Complete  04/20/2016  CLINICAL DATA:  35 year old female with left lower quadrant pelvic pain EXAM: TRANSABDOMINAL AND TRANSVAGINAL ULTRASOUND OF PELVIS TECHNIQUE: Both transabdominal and transvaginal ultrasound examinations of the pelvis were performed. Transabdominal technique was performed for global imaging of the pelvis including uterus, ovaries, adnexal regions, and pelvic cul-de-sac. It was necessary to proceed with endovaginal exam following the transabdominal exam to visualize the endometrium and the ovaries. COMPARISON:  CT dated 04/25/2010 FINDINGS: Uterus The uterus is anteverted and heterogeneous measuring 9.7 x 5 3 x 5 7 cm. There is a 1.7 x 1.5 x 1.8 cm  anterior body mural fibroid and a 1.7 x 2.1 x 1.8 cm posterior frontal sub serosal fibroid. Endometrium Thickness: 10 mm.  No focal abnormality visualized. Right ovary Measurements: 2.7 x 1.6 x 1.8 cm. Normal appearance/no adnexal mass. Left ovary Measurements: 3.4 x 1.7 x 2.8 cm. There is a 2.0 x 1.5 x 1.8 cm dominant follicle/involuting cyst in the left ovary. Other findings No abnormal free fluid. IMPRESSION: A 2 cm left ovarian dominant follicle/cyst. Two small uterine fibroids. Electronically Signed   By: Anner Crete M.D.   On: 04/20/2016 06:27   I have personally reviewed and evaluated these images and lab results as part of my medical decision-making.   EKG Interpretation None      MDM   Patient with left-sided abdominal pain and low back pain for 2 weeks, concern for STD exposure. She has intermittent vaginal discharge which she states is not that different from normal, no change in consistency, smell or color. She denies any vaginal itching or rash. She does have new dyspareunia which was severe 2 days ago when she had to stop having sex because it was too painful. No urinary symptoms. She denies nausea, vomiting, constipation, diarrhea, fever, sweats, chills.  She is well-appearing but did look uncomfortable, basic lab work was significant for mild hyperkalemia. No leukocytosis.  Urinalysis is pertinent for small leukocytes, negative nitrites, contaminated sample with rare bacteria on microscopy.  Pelvic exam was significant for moderate amount of thin yellow to green vaginal discharge with friable cervix, thin streaking blood, CMT and bilateral adnexal tenderness. Pelvic ultrasound was obtained which is significant for a 2 cm left ovarian dominant follicle, and 2 small fibroids, no evidence of TOA.  Patient was treated for STDs and discharged home with Flagyl and Doxy. She was instructed to have all partners tested and treated and to abstain from sexual intercourse until treatment is  completed. Return precautions reviewed, patient verbalizes understanding. She refused pain medication multiple times during her visit. She had mild nausea when coming back from ultrasound but also refused Zofran and this was given ginger ale and crackers which she tolerated. She was discharged home in good condition with stable vital signs.  Final diagnoses:  PID (acute pelvic inflammatory disease)      Delsa Grana, PA-C 04/20/16 [place], MD 04/23/16 [id number]

## 2016-04-20 NOTE — ED Notes (Signed)
Pt. reports LLQ pain and low back pain for 2 weeks , denies nausea or vomitting , no diarrhea or dysuria .

## 2016-04-20 NOTE — ED Notes (Signed)
Pt taken to US

## 2016-06-21 LAB — HM PAP SMEAR: HM Pap smear: NORMAL

## 2016-10-03 ENCOUNTER — Encounter (HOSPITAL_COMMUNITY): Payer: Self-pay | Admitting: *Deleted

## 2016-10-03 ENCOUNTER — Emergency Department (HOSPITAL_COMMUNITY)
Admission: EM | Admit: 2016-10-03 | Discharge: 2016-10-04 | Disposition: A | Discharge status: Home / Self Care | Payer: Self-pay | Attending: Emergency Medicine | Admitting: Emergency Medicine

## 2016-10-03 DIAGNOSIS — F17200 Nicotine dependence, unspecified, uncomplicated: Secondary | ICD-10-CM | POA: Insufficient documentation

## 2016-10-03 DIAGNOSIS — Z79899 Other long term (current) drug therapy: Secondary | ICD-10-CM | POA: Insufficient documentation

## 2016-10-03 DIAGNOSIS — K5732 Diverticulitis of large intestine without perforation or abscess without bleeding: Secondary | ICD-10-CM | POA: Insufficient documentation

## 2016-10-03 LAB — COMPREHENSIVE METABOLIC PANEL
ALT: 13 U/L — ABNORMAL LOW (ref 14–54)
AST: 21 U/L (ref 15–41)
Albumin: 4.1 g/dL (ref 3.5–5.0)
Alkaline Phosphatase: 69 U/L (ref 38–126)
Anion gap: 10 (ref 5–15)
BUN: 10 mg/dL (ref 6–20)
CO2: 21 mmol/L — ABNORMAL LOW (ref 22–32)
Calcium: 9.4 mg/dL (ref 8.9–10.3)
Chloride: 106 mmol/L (ref 101–111)
Creatinine, Ser: 0.86 mg/dL (ref 0.44–1.00)
GFR calc Af Amer: >60 mL/min (ref >60)
GFR calc non Af Amer: >60 mL/min (ref >60)
Glucose, Bld: 81 mg/dL (ref 65–99)
Potassium: 3.6 mmol/L (ref 3.5–5.1)
Sodium: 137 mmol/L (ref 135–145)
Total Bilirubin: 0.7 mg/dL (ref 0.3–1.2)
Total Protein: 7.3 g/dL (ref 6.5–8.1)

## 2016-10-03 LAB — CBC
HCT: 40.2 % (ref 36.0–46.0)
Hemoglobin: 13.1 g/dL (ref 12.0–15.0)
MCH: 26 pg (ref 26.0–34.0)
MCHC: 32.6 g/dL (ref 30.0–36.0)
MCV: 79.9 fL (ref 78.0–100.0)
Platelets: 283 10*3/uL (ref 150–400)
RBC: 5.03 MIL/uL (ref 3.87–5.11)
RDW: 14.7 % (ref 11.5–15.5)
WBC: 5.9 10*3/uL (ref 4.0–10.5)

## 2016-10-03 LAB — LIPASE, BLOOD: Lipase: 13 U/L (ref 11–51)

## 2016-10-03 NOTE — ED Triage Notes (Signed)
Pt c/o Left sided abdominal pain x 1 month. Has tired OTC stool softeners and laxatives without relief. Also endorses nausea. Hx of diverticulitis.

## 2016-10-04 ENCOUNTER — Emergency Department (HOSPITAL_COMMUNITY): Payer: Self-pay

## 2016-10-04 ENCOUNTER — Telehealth: Payer: Self-pay | Admitting: *Deleted

## 2016-10-04 LAB — I-STAT BETA HCG BLOOD, ED (MC, WL, AP ONLY): I-stat hCG, quantitative: <5 m[IU]/mL (ref <5)

## 2016-10-04 MED ORDER — ONDANSETRON HCL 4 MG PO TABS: 4.0000 mg | ORAL_TABLET | Freq: Four times a day (QID) | ORAL | 0 refills | Status: DC

## 2016-10-04 MED ORDER — SODIUM CHLORIDE 0.9 % IV BOLUS (SEPSIS)
500.0000 mL | Freq: Once | INTRAVENOUS | Status: AC
  Administered 2016-10-04: 500 mL via INTRAVENOUS

## 2016-10-04 MED ORDER — MOXIFLOXACIN HCL 400 MG PO TABS: 400.0000 mg | ORAL_TABLET | Freq: Every day | ORAL | 0 refills | Status: AC

## 2016-10-04 MED ORDER — IOPAMIDOL (ISOVUE-300) INJECTION 61%
INTRAVENOUS | Status: AC
  Administered 2016-10-04: 100 mL via INTRAVENOUS
  Filled 2016-10-04: qty 100

## 2016-10-04 MED ORDER — ONDANSETRON HCL 4 MG/2ML IJ SOLN
4.0000 mg | Freq: Once | INTRAMUSCULAR | Status: AC
  Administered 2016-10-04: 4 mg via INTRAVENOUS
  Filled 2016-10-04: qty 2

## 2016-10-04 NOTE — Telephone Encounter (Addendum)
Call to patient due to patient calling the ED related to an issue with affording her medication. Patient states the prescription she received will cost $100 for 10 pills. [name] PA provided a new prescription for the patient. CM spoke with the patient who would like the prescription called in to [place] on [address]. A Good Rx coupon for Augmentin was texted to the patient. She confirms receipt of the coupon. The cost of the prescription with the coupon will be $14.76. Patient states she can afford the medication. [name] RN BSN CCM   Patient will need 28 pills. Informed patient the cost with a coupon will be $24.05. Patient states she can afford the cost. Texted a new coupon for the 28 day supply. Presenter, broadcasting BSN CCM

## 2016-10-04 NOTE — Discharge Instructions (Signed)
RETURN TO THE EMERGENCY DEPARTMENT WITH ANY HIGH FEVER, SEVERE PAIN, BLOOD PER RECTUM OR NEW CONCERN.

## 2016-10-04 NOTE — ED Provider Notes (Signed)
[facility] DEPT Provider Note   CSN: [id number] Arrival date & time: 10/03/16  2154     History   Chief Complaint Chief Complaint  Patient presents with  . Abdominal Pain    HPI GIOVANA Watson is a 35 y.o. female.  Patient with history of diverticulitis presents with left sided abdominal pain for the past 1 month, progressively worsening. Nausea since symptoms began with vomiting yesterday. No diarrhea, hematochezia or melena. She reports not BM in 24 hours despite use of Colace, Miralax and Mag Citrate. She continues to pass flatus. No fever at any time.    The history is provided by the patient. No language interpreter was used.    Past Medical History:  Diagnosis Date  . Diverticulitis   . Diverticulitis   . Diverticulosis   . Obesity     There are no active problems to display for this patient.   Past Surgical History:  Procedure Laterality Date  . CESAREAN SECTION    . CESAREAN SECTION    . TUBAL LIGATION    . TUBAL LIGATION      OB History    No data available       Home Medications    Prior to Admission medications   Medication Sig Start Date End Date Taking? Authorizing Provider  cyclobenzaprine (FLEXERIL) 10 MG tablet Take 1 tablet (10 mg total) by mouth 2 (two) times daily as needed for muscle spasms. 09/14/15  Yes Nona Dell, PA-C  docusate sodium (COLACE) 100 MG capsule Take 100 mg by mouth daily as needed for mild constipation.   Yes Historical Provider, MD  HYDROcodone-acetaminophen (NORCO/VICODIN) 5-325 MG tablet Take 1 tablet by mouth every 6 (six) hours as needed for moderate pain.   Yes Historical Provider, MD  ibuprofen (ADVIL,MOTRIN) 800 MG tablet Take 1 tablet (800 mg total) by mouth 3 (three) times daily. Patient taking differently: Take 800 mg by mouth every 8 (eight) hours as needed for moderate pain.  09/14/15  Yes Chesley Noon Nadeau, PA-C  senna (SENOKOT) 8.6 MG TABS tablet Take 1 tablet by mouth daily as  needed for mild constipation.   Yes Historical Provider, MD  metroNIDAZOLE (FLAGYL) 500 MG tablet Take 1 tablet (500 mg total) by mouth 2 (two) times daily. Patient not taking: Reported on 10/03/2016 04/20/16   Delsa Grana, PA-C    Family History No family history on file.  Social History Social History  Substance Use Topics  . Smoking status: Current Every Day Smoker  . Smokeless tobacco: Never Used  . Alcohol use No     Allergies   Flagyl [metronidazole]   Review of Systems Review of Systems  Constitutional: Negative for chills and fever.  Respiratory: Negative.   Cardiovascular: Negative.   Gastrointestinal: Positive for abdominal pain and constipation. Negative for blood in stool, nausea and vomiting.  Genitourinary: Negative.  Negative for dysuria, frequency and vaginal discharge.  Musculoskeletal: Negative.  Negative for back pain.  Neurological: Negative.      Physical Exam Updated Vital Signs BP 131/98   Pulse 63   Temp 98 F (36.7 C) (Oral)   Resp 16   LMP 09/26/2016   SpO2 98%   Physical Exam  Constitutional: She is oriented to person, place, and time. She appears well-developed and well-nourished.  Neck: Normal range of motion.  Cardiovascular: Normal rate.   Pulmonary/Chest: Effort normal. She exhibits no tenderness.  Abdominal: Soft. She exhibits no mass. There is tenderness (LUQ and suprapubic tenderness). There  is no guarding.  Musculoskeletal: Normal range of motion.  Neurological: She is alert and oriented to person, place, and time.  Skin: Skin is warm and dry.     ED Treatments / Results  Labs (all labs ordered are listed, but only abnormal results are displayed) Labs Reviewed  COMPREHENSIVE METABOLIC PANEL - Abnormal; Notable for the following:       Result Value   CO2 21 (*)    ALT 13 (*)    All other components within normal limits  LIPASE, BLOOD  CBC  URINALYSIS, ROUTINE W REFLEX MICROSCOPIC  I-STAT BETA HCG BLOOD, ED ([facility], [facility], [facility]  ONLY)   Results for orders placed or performed during the hospital encounter of 10/03/16  Lipase, blood  Result Value Ref Range   Lipase 13 11 - 51 U/L  Comprehensive metabolic panel  Result Value Ref Range   Sodium 137 135 - 145 mmol/L   Potassium 3.6 3.5 - 5.1 mmol/L   Chloride 106 101 - 111 mmol/L   CO2 21 (L) 22 - 32 mmol/L   Glucose, Bld 81 65 - 99 mg/dL   BUN 10 6 - 20 mg/dL   Creatinine, Ser 0.86 0.44 - 1.00 mg/dL   Calcium 9.4 8.9 - 10.3 mg/dL   Total Protein 7.3 6.5 - 8.1 g/dL   Albumin 4.1 3.5 - 5.0 g/dL   AST 21 15 - 41 U/L   ALT 13 (L) 14 - 54 U/L   Alkaline Phosphatase 69 38 - 126 U/L   Total Bilirubin 0.7 0.3 - 1.2 mg/dL   GFR calc non Af Amer >60 >60 mL/min   GFR calc Af Amer >60 >60 mL/min   Anion gap 10 5 - 15  CBC  Result Value Ref Range   WBC 5.9 4.0 - 10.5 K/uL   RBC 5.03 3.87 - 5.11 MIL/uL   Hemoglobin 13.1 12.0 - 15.0 g/dL   HCT 40.2 36.0 - 46.0 %   MCV 79.9 78.0 - 100.0 fL   MCH 26.0 26.0 - 34.0 pg   MCHC 32.6 30.0 - 36.0 g/dL   RDW 14.7 11.5 - 15.5 %   Platelets 283 150 - 400 K/uL  I-Stat beta hCG blood, ED  Result Value Ref Range   I-stat hCG, quantitative <5.0 <5 mIU/mL   Comment 3             EKG  EKG Interpretation None       Radiology Ct Abdomen Pelvis W Contrast  Result Date: 10/04/2016 CLINICAL DATA:  Abdominal pain.  Diverticulosis. EXAM: CT ABDOMEN AND PELVIS WITH CONTRAST TECHNIQUE: Multidetector CT imaging of the abdomen and pelvis was performed using the standard protocol following bolus administration of intravenous contrast. CONTRAST:  1 ISOVUE-300 IOPAMIDOL (ISOVUE-300) INJECTION 61% COMPARISON:  04/25/2010 FINDINGS: Lower chest: No acute abnormality. Hepatobiliary: No focal liver abnormality is seen. No gallstones, gallbladder wall thickening, or biliary dilatation. Pancreas: Unremarkable. No pancreatic ductal dilatation or surrounding inflammatory changes. Spleen: Normal in size without focal abnormality. Adrenals/Urinary  Tract: Adrenal glands are unremarkable. Kidneys are normal, without renal calculi, focal lesion, or hydronephrosis. Bladder is unremarkable. Stomach/Bowel: Stomach and small bowel are unremarkable. Appendix is normal. There is extensive diverticulosis. There is acute inflammation surrounding a diverticulum of the mid descending colon with appearances typical of acute diverticulitis. There is another focus of acute diverticulitis involving the proximal sigmoid colon. No abscess. No extraluminal gas. Vascular/Lymphatic: No significant vascular findings are present. No enlarged abdominal or pelvic lymph nodes. Reproductive: Uterus  and bilateral adnexa are unremarkable. Other: No ascites. Musculoskeletal: No significant skeletal lesion. IMPRESSION: Acute diverticulitis in the mid descending colon and also in the proximal sigmoid colon. No abscess. Electronically Signed   By: Andreas Newport M.D.   On: 10/04/2016 02:10    Procedures Procedures (including critical care time)  Medications Ordered in ED Medications  sodium chloride 0.9 % bolus 500 mL (500 mLs Intravenous New Bag/Given 10/04/16 0112)  ondansetron (ZOFRAN) injection 4 mg (4 mg Intravenous Given 10/04/16 0115)  iopamidol (ISOVUE-300) 61 % injection (100 mLs Intravenous Contrast Given 10/04/16 0126)     Initial Impression / Assessment and Plan / ED Course  I have reviewed the triage vital signs and the nursing notes.  Pertinent labs & imaging results that were available during my care of the patient were reviewed by me and considered in my medical decision making (see chart for details).  Clinical Course     Patient presents with left sided abdominal pain x 1 month. She is having difficulty moving her bowels. History of diverticulitis.  CT scan shows acute diverticulitis in descending and sigmoid colon. No abscess.   The patient has no fever, normal vitals, is able to eat and drink. No evidence obstruction. She can be discharged home on  Avalox once daily and is encouraged to follow up with GI. Referral made.   Final Clinical Impressions(s) / ED Diagnoses   Final diagnoses:  None   1. Diverticulitis  New Prescriptions New Prescriptions   No medications on file     Charlann Lange, PA-C 10/04/16 0236    Everlene Balls, MD 10/04/16 [phone]

## 2016-10-04 NOTE — ED Notes (Signed)
Patient transported to CT 

## 2017-05-21 ENCOUNTER — Ambulatory Visit: Payer: Self-pay | Admitting: Physician Assistant

## 2017-05-21 ENCOUNTER — Other Ambulatory Visit
Admission: RE | Admit: 2017-05-21 | Discharge: 2017-05-21 | Disposition: A | Discharge status: Home / Self Care | Payer: 59 | Source: Ambulatory Visit | Attending: Physician Assistant | Admitting: Physician Assistant

## 2017-05-21 ENCOUNTER — Encounter: Payer: Self-pay | Admitting: Physician Assistant

## 2017-05-21 VITALS — BP 130/90 | HR 79 | Temp 98.3°F

## 2017-05-21 DIAGNOSIS — H0520 Unspecified exophthalmos: Secondary | ICD-10-CM | POA: Diagnosis not present

## 2017-05-21 DIAGNOSIS — H5711 Ocular pain, right eye: Secondary | ICD-10-CM

## 2017-05-21 LAB — TSH: TSH: 0.354 u[IU]/mL (ref 0.350–4.500)

## 2017-05-21 MED ORDER — TOBRAMYCIN-DEXAMETHASONE 0.3-0.1 % OP SUSP: 2.0000 [drp] | OPHTHALMIC | 0 refills | Status: DC

## 2017-05-21 NOTE — Progress Notes (Signed)
Spoke with patient informed her that TSH is slightly elevated and T4 and T3 result are still in process. instructed her to start prescribed medication per Manuela Schwartz and see if that helps If not will refer her to specialist.

## 2017-05-21 NOTE — Progress Notes (Signed)
S: pt c/o r eye pain, feels sore, ?if swollen, no change in vision, states it does drain sometimes, a little crusty in the mornings, no drainage during the day, been going for "awhile" , no fever/chills or hx of glaucoma  O: vitals wnl, nad, perrl eomi, r eye does seem to protrude a little more than the left, a little tender to palp, neck supple no lymph, lungs c t a, cv rrr, cnII-XII  A: exopthalmus  P: labs for thyroid functions, if neg use rx eye drops

## 2017-05-22 LAB — T4: T4, Total: 8.8 ug/dL (ref 4.5–12.0)

## 2017-05-22 LAB — T3 UPTAKE: T3 Uptake Ratio: 27 % (ref 24–39)

## 2017-11-10 DIAGNOSIS — Z202 Contact with and (suspected) exposure to infections with a predominantly sexual mode of transmission: Secondary | ICD-10-CM | POA: Diagnosis not present

## 2017-11-21 ENCOUNTER — Ambulatory Visit (INDEPENDENT_AMBULATORY_CARE_PROVIDER_SITE_OTHER): Payer: 59 | Admitting: Family Medicine

## 2017-11-21 ENCOUNTER — Encounter: Payer: Self-pay | Admitting: Family Medicine

## 2017-11-21 VITALS — BP 102/70 | HR 82 | Temp 99.0°F | Ht 65.5 in | Wt 255.0 lb

## 2017-11-21 DIAGNOSIS — Z7689 Persons encountering health services in other specified circumstances: Secondary | ICD-10-CM | POA: Diagnosis not present

## 2017-11-21 DIAGNOSIS — Z114 Encounter for screening for human immunodeficiency virus [HIV]: Secondary | ICD-10-CM | POA: Diagnosis not present

## 2017-11-21 DIAGNOSIS — F419 Anxiety disorder, unspecified: Secondary | ICD-10-CM

## 2017-11-21 DIAGNOSIS — Z113 Encounter for screening for infections with a predominantly sexual mode of transmission: Secondary | ICD-10-CM

## 2017-11-21 DIAGNOSIS — F329 Major depressive disorder, single episode, unspecified: Secondary | ICD-10-CM | POA: Diagnosis not present

## 2017-11-21 DIAGNOSIS — F32A Depression, unspecified: Secondary | ICD-10-CM

## 2017-11-21 DIAGNOSIS — Z13220 Encounter for screening for lipoid disorders: Secondary | ICD-10-CM

## 2017-11-21 LAB — LIPID PANEL
Cholesterol: 183 mg/dL (ref 0–200)
HDL: 60.8 mg/dL (ref >39.00)
LDL Cholesterol: 107 mg/dL — ABNORMAL HIGH (ref 0–99)
NonHDL: 122.41
Total CHOL/HDL Ratio: 3
Triglycerides: 75 mg/dL (ref 0.0–149.0)
VLDL: 15 mg/dL (ref 0.0–40.0)

## 2017-11-21 LAB — CBC WITH DIFFERENTIAL/PLATELET
Basophils Absolute: 0 10*3/uL (ref 0.0–0.1)
Basophils Relative: 0.6 % (ref 0.0–3.0)
Eosinophils Absolute: 0.1 10*3/uL (ref 0.0–0.7)
Eosinophils Relative: 1.9 % (ref 0.0–5.0)
HCT: 38 % (ref 36.0–46.0)
Hemoglobin: 12.2 g/dL (ref 12.0–15.0)
Lymphocytes Relative: 27.5 % (ref 12.0–46.0)
Lymphs Abs: 1.7 10*3/uL (ref 0.7–4.0)
MCHC: 32.2 g/dL (ref 30.0–36.0)
MCV: 77.6 fl — ABNORMAL LOW (ref 78.0–100.0)
Monocytes Absolute: 0.5 10*3/uL (ref 0.1–1.0)
Monocytes Relative: 8.7 % (ref 3.0–12.0)
Neutro Abs: 3.9 10*3/uL (ref 1.4–7.7)
Neutrophils Relative %: 61.3 % (ref 43.0–77.0)
Platelets: 288 10*3/uL (ref 150.0–400.0)
RBC: 4.9 Mil/uL (ref 3.87–5.11)
RDW: 15.6 % — ABNORMAL HIGH (ref 11.5–15.5)
WBC: 6.3 10*3/uL (ref 4.0–10.5)

## 2017-11-21 LAB — COMPREHENSIVE METABOLIC PANEL
ALT: 6 U/L (ref 0–35)
AST: 10 U/L (ref 0–37)
Albumin: 4.1 g/dL (ref 3.5–5.2)
Alkaline Phosphatase: 69 U/L (ref 39–117)
BUN: 12 mg/dL (ref 6–23)
CO2: 24 mEq/L (ref 19–32)
Calcium: 9.2 mg/dL (ref 8.4–10.5)
Chloride: 106 mEq/L (ref 96–112)
Creatinine, Ser: 0.8 mg/dL (ref 0.40–1.20)
GFR: 103.66 mL/min (ref >60.00)
Glucose, Bld: 80 mg/dL (ref 70–99)
Potassium: 3.9 mEq/L (ref 3.5–5.1)
Sodium: 137 mEq/L (ref 135–145)
Total Bilirubin: 0.4 mg/dL (ref 0.2–1.2)
Total Protein: 7.1 g/dL (ref 6.0–8.3)

## 2017-11-21 LAB — TSH: TSH: 0.68 u[IU]/mL (ref 0.35–4.50)

## 2017-11-21 LAB — T4, FREE: Free T4: 0.95 ng/dL (ref 0.60–1.60)

## 2017-11-21 MED ORDER — ESCITALOPRAM OXALATE 10 MG PO TABS: 10.0000 mg | ORAL_TABLET | Freq: Every day | ORAL | 1 refills | Status: DC

## 2017-11-21 NOTE — Patient Instructions (Addendum)
Living With Anxiety After being diagnosed with an anxiety disorder, you may be relieved to know why you have felt or behaved a certain way. It is natural to also feel overwhelmed about the treatment ahead and what it will mean for your life. With care and support, you can manage this condition and recover from it. How to cope with anxiety Dealing with stress Stress is your body's reaction to life changes and events, both good and bad. Stress can last just a few hours or it can be ongoing. Stress can play a major role in anxiety, so it is important to learn both how to cope with stress and how to think about it differently. Talk with your health care provider or a counselor to learn more about stress reduction. He or she may suggest some stress reduction techniques, such as:  Music therapy. This can include creating or listening to music that you enjoy and that inspires you.  Mindfulness-based meditation. This involves being aware of your normal breaths, rather than trying to control your breathing. It can be done while sitting or walking.  Centering prayer. This is a kind of meditation that involves focusing on a word, phrase, or sacred image that is meaningful to you and that brings you peace.  Deep breathing. To do this, expand your stomach and inhale slowly through your nose. Hold your breath for 3-5 seconds. Then exhale slowly, allowing your stomach muscles to relax.  Self-talk. This is a skill where you identify thought patterns that lead to anxiety reactions and correct those thoughts.  Muscle relaxation. This involves tensing muscles then relaxing them.  Choose a stress reduction technique that fits your lifestyle and personality. Stress reduction techniques take time and practice. Set aside 5-15 minutes a day to do them. Therapists can offer training in these techniques. The training may be covered by some insurance plans. Other things you can do to manage stress include:  Keeping a  stress diary. This can help you learn what triggers your stress and ways to control your response.  Thinking about how you respond to certain situations. You may not be able to control everything, but you can control your reaction.  Making time for activities that help you relax, and not feeling guilty about spending your time in this way.  Therapy combined with coping and stress-reduction skills provides the best chance for successful treatment. Medicines Medicines can help ease symptoms. Medicines for anxiety include:  Anti-anxiety drugs.  Antidepressants.  Beta-blockers.  Medicines may be used as the main treatment for anxiety disorder, along with therapy, or if other treatments are not working. Medicines should be prescribed by a health care provider. Relationships Relationships can play a big part in helping you recover. Try to spend more time connecting with trusted friends and family members. Consider going to couples counseling, taking family education classes, or going to family therapy. Therapy can help you and others better understand the condition. How to recognize changes in your condition Everyone has a different response to treatment for anxiety. Recovery from anxiety happens when symptoms decrease and stop interfering with your daily activities at home or work. This may mean that you will start to:  Have better concentration and focus.  Sleep better.  Be less irritable.  Have more energy.  Have improved memory.  It is important to recognize when your condition is getting worse. Contact your health care provider if your symptoms interfere with home or work and you do not feel like your condition   is improving. Where to find help and support: You can get help and support from these sources:  Self-help groups.  Online and [employer].  A trusted spiritual leader.  Couples counseling.  Family education classes.  Family therapy.  Follow these  instructions at home:  Eat a healthy diet that includes plenty of vegetables, fruits, whole grains, low-fat dairy products, and lean protein. Do not eat a lot of foods that are high in solid fats, added sugars, or salt.  Exercise. Most adults should do the following: ? Exercise for at least 150 minutes each week. The exercise should increase your heart rate and make you sweat (moderate-intensity exercise). ? Strengthening exercises at least twice a week.  Cut down on caffeine, tobacco, alcohol, and other potentially harmful substances.  Get the right amount and quality of sleep. Most adults need 7-9 hours of sleep each night.  Make choices that simplify your life.  Take over-the-counter and prescription medicines only as told by your health care provider.  Avoid caffeine, alcohol, and certain over-the-counter cold medicines. These may make you feel worse. Ask your pharmacist which medicines to avoid.  Keep all follow-up visits as told by your health care provider. This is important. Questions to ask your health care provider  Would I benefit from therapy?  How often should I follow up with a health care provider?  How long do I need to take medicine?  Are there any long-term side effects of my medicine?  Are there any alternatives to taking medicine? Contact a health care provider if:  You have a hard time staying focused or finishing daily tasks.  You spend many hours a day feeling worried about everyday life.  You become exhausted by worry.  You start to have headaches, feel tense, or have nausea.  You urinate more than normal.  You have diarrhea. Get help right away if:  You have a racing heart and shortness of breath.  You have thoughts of hurting yourself or others. If you ever feel like you may hurt yourself or others, or have thoughts about taking your own life, get help right away. You can go to your nearest emergency department or call:  Your local emergency  services (911 in the [place]  A suicide crisis helpline, such as the [facility] at 602-278-8328. This is open 24-hours a day.  Summary  Taking steps to deal with stress can help calm you.  Medicines cannot cure anxiety disorders, but they can help ease symptoms.  Family, friends, and partners can play a big part in helping you recover from an anxiety disorder. This information is not intended to replace advice given to you by your health care provider. Make sure you discuss any questions you have with your health care provider. Document Released: 10/09/2016 Document Revised: 10/09/2016 Document Reviewed: 10/09/2016 Elsevier Interactive Patient Education  2018 [place] With Depression Everyone experiences occasional disappointment, sadness, and loss in their lives. When you are feeling down, blue, or sad for at least 2 weeks in a row, it may mean that you have depression. Depression can affect your thoughts and feelings, relationships, daily activities, and physical health. It is caused by changes in the way your brain functions. If you receive a diagnosis of depression, your health care provider will tell you which type of depression you have and what treatment options are available to you. If you are living with depression, there are ways to help you recover from it and also  ways to prevent it from coming back. How to cope with lifestyle changes Coping with stress Stress is your body's reaction to life changes and events, both good and bad. Stressful situations may include:  Getting married.  The death of a spouse.  Losing a job.  Retiring.  Having a baby.  Stress can last just a few hours or it can be ongoing. Stress can play a major role in depression, so it is important to learn both how to cope with stress and how to think about it differently. Talk with your health care provider or a counselor if you would like to learn more about stress  reduction. He or she may suggest some stress reduction techniques, such as:  Music therapy. This can include creating music or listening to music. Choose music that you enjoy and that inspires you.  Mindfulness-based meditation. This kind of meditation can be done while sitting or walking. It involves being aware of your normal breaths, rather than trying to control your breathing.  Centering prayer. This is a kind of meditation that involves focusing on a spiritual word or phrase. Choose a word, phrase, or sacred image that is meaningful to you and that brings you peace.  Deep breathing. To do this, expand your stomach and inhale slowly through your nose. Hold your breath for 3-5 seconds, then exhale slowly, allowing your stomach muscles to relax.  Muscle relaxation. This involves intentionally tensing muscles then relaxing them.  Choose a stress reduction technique that fits your lifestyle and personality. Stress reduction techniques take time and practice to develop. Set aside 5-15 minutes a day to do them. Therapists can offer training in these techniques. The training may be covered by some insurance plans. Other things you can do to manage stress include:  Keeping a stress diary. This can help you learn what triggers your stress and ways to control your response.  Understanding what your limits are and saying no to requests or events that lead to a schedule that is too full.  Thinking about how you respond to certain situations. You may not be able to control everything, but you can control how you react.  Adding humor to your life by watching funny films or TV shows.  Making time for activities that help you relax and not feeling guilty about spending your time this way.  Medicines Your health care provider may suggest certain medicines if he or she feels that they will help improve your condition. Avoid using alcohol and other substances that may prevent your medicines from working  properly (may interact). It is also important to:  Talk with your pharmacist or health care provider about all the medicines that you take, their possible side effects, and what medicines are safe to take together.  Make it your goal to take part in all treatment decisions (shared decision-making). This includes giving input on the side effects of medicines. It is best if shared decision-making with your health care provider is part of your total treatment plan.  If your health care provider prescribes a medicine, you may not notice the full benefits of it for 4-8 weeks. Most people who are treated for depression need to be on medicine for at least 6-12 months after they feel better. If you are taking medicines as part of your treatment, do not stop taking medicines without first talking to your health care provider. You may need to have the medicine slowly decreased (tapered) over time to decrease the risk of harmful  side effects. Relationships Your health care provider may suggest family therapy along with individual therapy and drug therapy. While there may not be family problems that are causing you to feel depressed, it is still important to make sure your family learns as much as they can about your mental health. Having your family's support can help make your treatment successful. How to recognize changes in your condition Everyone has a different response to treatment for depression. Recovery from major depression happens when you have not had signs of major depression for two months. This may mean that you will start to:  Have more interest in doing activities.  Feel less hopeless than you did 2 months ago.  Have more energy.  Overeat less often, or have better or improving appetite.  Have better concentration.  Your health care provider will work with you to decide the next steps in your recovery. It is also important to recognize when your condition is getting worse. Watch for these  signs:  Having fatigue or low energy.  Eating too much or too little.  Sleeping too much or too little.  Feeling restless, agitated, or hopeless.  Having trouble concentrating or making decisions.  Having unexplained physical complaints.  Feeling irritable, angry, or aggressive.  Get help as soon as you or your family members notice these symptoms coming back. How to get support and help from others How to talk with friends and family members about your condition Talking to friends and family members about your condition can provide you with one way to get support and guidance. Reach out to trusted friends or family members, explain your symptoms to them, and let them know that you are working with a health care provider to treat your depression. Financial resources Not all insurance plans cover mental health care, so it is important to check with your insurance carrier. If paying for co-pays or counseling services is a problem, search for a local or county mental health care center. They may be able to offer public mental health care services at low or no cost when you are not able to see a private health care provider. If you are taking medicine for depression, you may be able to get the generic form, which may be less expensive. Some makers of prescription medicines also offer help to patients who cannot afford the medicines they need. Follow these instructions at home:  Get the right amount and quality of sleep.  Cut down on using caffeine, tobacco, alcohol, and other potentially harmful substances.  Try to exercise, such as walking or lifting small weights.  Take over-the-counter and prescription medicines only as told by your health care provider.  Eat a healthy diet that includes plenty of vegetables, fruits, whole grains, low-fat dairy products, and lean protein. Do not eat a lot of foods that are high in solid fats, added sugars, or salt.  Keep all follow-up visits as told  by your health care provider. This is important. Contact a health care provider if:  You stop taking your antidepressant medicines, and you have any of these symptoms: ? Nausea. ? Headache. ? Feeling lightheaded. ? Chills and body aches. ? Not being able to sleep (insomnia).  You or your friends and family think your depression is getting worse. Get help right away if:  You have thoughts of hurting yourself or others. If you ever feel like you may hurt yourself or others, or have thoughts about taking your own life, get help right away. You  can go to your nearest emergency department or call:  Your local emergency services (911 in the [place]  A suicide crisis helpline, such as the [facility] at (281)587-7240. This is open 24-hours a day.  Summary  If you are living with depression, there are ways to help you recover from it and also ways to prevent it from coming back.  Work with your health care team to create a management plan that includes counseling, stress management techniques, and healthy lifestyle habits. This information is not intended to replace advice given to you by your health care provider. Make sure you discuss any questions you have with your health care provider. Document Released: 09/17/2016 Document Revised: 09/17/2016 Document Reviewed: 09/17/2016 Elsevier Interactive Patient Education  2018 [place] Sexually Transmitted Infections, Adult Sexually transmitted infections (STIs) are diseases that are passed (transmitted) from person to person through bodily fluids exchanged during sex or sexual contact. Bodily fluids include saliva, semen, blood, vaginal mucus, and urine. You may have an increased risk for developing an STI if you have unprotected oral, vaginal, or anal sex. Some common STIs include:  Herpes.  Hepatitis B.  Chlamydia.  Gonorrhea.  Syphilis.  HPV (human papillomavirus).  HIV  (humanimmunodeficiency virus), the virus that can cause AIDS (acquired immunodeficiency virus).  How can I protect myself from sexually transmitted infections? The only way to completely prevent STIs is not to have sex of any kind (practice abstinence). This includes oral, vaginal, or anal sex. If you are sexually active, take these actions to lower your risk of getting an STI:  Have only one sex partner (be monogamous) or limit the number of sexual partners you have.  Stay up-to-date on immunizations. Certain vaccines can lower your risk of getting certain STIs, such as: ? Hepatitis A and B vaccines. You may have been vaccinated as a young child, but likely need a booster shot as a teen or young adult. ? HPV vaccine. This vaccine is recommended if you are a man under age 25 or a woman under age 21.  Use methods that prevent the exchange of body fluids between partners (barrier protection) every time you have sex. Barrier protection can be used during oral, vaginal, or anal sex. Commonly used barrier methods include: ? Female condom. ? Female condom. ? Dental dam.  Get tested regularly for STIs. Have your sexual partner get tested regularly as well.  Avoid mixing alcohol, drugs, and sex. Alcohol and drug use can affect your ability to make good decisions and can lead to risky sexual behaviors.  Ask your health care provider about taking pre-exposure prophylaxis (PrEP) to prevent HIV infection if you: ? Have a HIV-positive sexual partner. ? Have multiple sexual partners or partners who do not know their HIV status, and do not regularly use a condom during sex. ? Use injection drugs and share needles.  Birth control pills, injections, implants, and intrauterine devices (IUDs) do not protect against STIs. To prevent both STIs and pregnancy, always use a condom with another form of birth control. Some STIs, such as herpes, are spread through skin to skin contact. A condom does not protect you  from getting such STIs. If you or your partner have herpes and there is an active flare with open sores, avoid all sexual contact. Why are these changes important? Taking steps to practice safe sex protects you and others. Many STIs can be cured. However, some STIs are not curable and will affect you for the  rest of your life. STIs can be passed on to another person even if you do not have symptoms. What can happen if changes are not made? Certain STIs may:  Require you to take medicine for the rest of your life.  Affect your ability to have children (your fertility).  Increase your risk for developing another STI or certain serious health conditions, such as: ? Cervical cancer. ? Head and neck cancer. ? Pelvic inflammatory disease (PID) in women. ? Organ damage or damage to other parts of your body, if the infection spreads.  Be passed to a baby during childbirth.  How are sexually transmitted infections treated? If you or your partner know or think that you may have an STI:  Talk with your healthcare provider about what can be done to treat it. Some STIs can be treated and cured with medicines.  For curable STIs, you and your partner should avoid sex during treatment and for several days after treatment is complete.  You and your partner should both be treated at the same time, if there is any chance that your partner is infected as well. If you get treatment but your partner does not, your partner can re-infect you when you resume sexual contact.  Do not have unprotected sex.  Where to find more information: Learn more about sexually transmitted diseases and infections from:  Centers for Disease Control and Prevention: ? More information about specific STIs: AppraiserFraud.fi ? Find places to get sexual health counseling and treatment for free or for a low cost: gettested.StoreMirror.com.cy  [place] Department of Health and Human Services:  http://white.info/.html  Summary  The only way to completely prevent STIs is not to have sex (practice abstinence), including oral, vaginal, or anal sex.  STIs can spread through saliva, semen, blood, vaginal mucus, urine, or sexual contact.  If you do have sex, limit your number of sexual partners and use a barrier protection method every time you have sex.  If you develop an STI, get treated right away and ask your partner to be treated as well. Do not resume having sex until both of you have completed treatment for the STI. This information is not intended to replace advice given to you by your health care provider. Make sure you discuss any questions you have with your health care provider. Document Released: 10/11/2016 Document Revised: 10/11/2016 Document Reviewed: 10/11/2016 Elsevier Interactive Patient Education  [employer].

## 2017-11-21 NOTE — Progress Notes (Signed)
Patient presents to clinic today to follow-up on chronic issues and establish care. Patient is accompanied by her FOB.  SUBJECTIVE: PMH:  Pt is a 37 yo with pmh sig for anxiety, depression, seasonal allergies, ?thyroid problems, GERD, diverticulitis.  Patient has not recently been seen by a physician.  Concern for STI: -Pt states she was recently contacted by a partner stating that he had chlamydia. -Pt went to like [facility] urgent care for evaluation.  He was at this time blood work was done and she was told that her "results were difficult to interpret regarding possible HSV infection"? -Pt states she has been asymptomatic up until this point but is now paranoid that she has herpes. -Pt would like a second opinion. -Pt denies dysuria, vaginal discharge, vaginal irritation. -Pt thinks she has an lesion near her vagina that was burning was tingling at one point.  Anxiety and depression: -Patient states she has been dealing with both for a while but has been under control. -Patient has never been on medication. -Patient thinks the recent events possible STI exposure have increased high anxiety and depression. -Patient states her mood and sleep are variable.  Allergies: Doxycycline-vomiting  Past surgical history: C-section 2003  Social history: Patient is single.  She is currently employed as an Corporate treasurer.  Patient has 2 children.  Patient currently smokes marijuana to help calm her nerves not to get high.  Patient endorses social alcohol use.  LMP January 2019.  Last Pap 2017.  Family medical history: Mom-Deceased, cancer (patient is unsure as her mother died when she was in the fifth grade), depression, early death Dad-alive, arthritis, COPD, depression, HLD, HTN, stroke Sister-Kim, alive, alcohol abuse, arthritis, depression, mental illness Daughter-alive, learning disability Son-alive, learning disability MGM-alive, arthritis, DM, heart disease, HTN, HLD, kidney disease,  stroke MGF-deceased, alcohol abuse, COPD PGM-alive, arthritis, DM, heart disease, HLD, HTN, stroke PGF-deceased, alcohol abuse  Health Maintenance: Immunizations --influenza vaccine 2018, TB skin test 2018 PAP -- 2017    Past Medical History:  Diagnosis Date  . Allergy   . Diverticulitis   . Diverticulitis   . Diverticulosis   . GERD (gastroesophageal reflux disease)   . Obesity   . Thyroid disease     Past Surgical History:  Procedure Laterality Date  . CESAREAN SECTION    . CESAREAN SECTION    . TUBAL LIGATION    . TUBAL LIGATION      Current Outpatient Medications on File Prior to Visit  Medication Sig Dispense Refill  . cyclobenzaprine (FLEXERIL) 10 MG tablet Take 1 tablet (10 mg total) by mouth 2 (two) times daily as needed for muscle spasms. 20 tablet 0  . docusate sodium (COLACE) 100 MG capsule Take 100 mg by mouth daily as needed for mild constipation.    Marland Kitchen HYDROcodone-acetaminophen (NORCO/VICODIN) 5-325 MG tablet Take 1 tablet by mouth every 6 (six) hours as needed for moderate pain.    Marland Kitchen ibuprofen (ADVIL,MOTRIN) 800 MG tablet Take 1 tablet (800 mg total) by mouth 3 (three) times daily. (Patient taking differently: Take 800 mg by mouth every 8 (eight) hours as needed for moderate pain. ) 21 tablet 0   No current facility-administered medications on file prior to visit.     Allergies  Allergen Reactions  . Flagyl [Metronidazole]     Vomiting     History reviewed. No pertinent family history.  Social History   Socioeconomic History  . Marital status: Single    Spouse name: Not on file  .  Number of children: Not on file  . Years of education: Not on file  . Highest education level: Not on file  Social Needs  . Financial resource strain: Not on file  . Food insecurity - worry: Not on file  . Food insecurity - inability: Not on file  . Transportation needs - medical: Not on file  . Transportation needs - non-medical: Not on file  Occupational History   . Not on file  Tobacco Use  . Smoking status: Current Every Day Smoker    Types: Cigarettes  . Smokeless tobacco: Never Used  Substance and Sexual Activity  . Alcohol use: No  . Drug use: Yes    Types: Marijuana  . Sexual activity: Yes    Birth control/protection: None, Surgical  Other Topics Concern  . Not on file  Social History Narrative  . Not on file    ROS General: Denies fever, chills, night sweats, changes in weight, changes in appetite HEENT: Denies headaches, ear pain, changes in vision, rhinorrhea, sore throat CV: Denies CP, palpitations, SOB, orthopnea Pulm: Denies SOB, cough, wheezing GI: Denies abdominal pain, nausea, vomiting, diarrhea, constipation GU: Denies dysuria, hematuria, frequency, vaginal discharge  +STI exposure Msk: Denies muscle cramps, joint pains Neuro: Denies weakness, numbness, tingling Skin: Denies rashes, bruising Psych: Denies hallucinations  +depression, anxiety   BP 102/70 (BP Location: Right Arm, Patient Position: Sitting, Cuff Size: Large)   Pulse 82   Temp 99 F (37.2 C) (Oral)   Ht 5' 5.5" (1.664 m)   Wt 255 lb (115.7 kg)   BMI 41.79 kg/m   Physical Exam Gen. Pleasant, well developed, well-nourished, in NAD HEENT - [place]/AT, PERRL, no scleral icterus, no nasal drainage, pharynx without erythema or exudate. Lungs: no use of accessory muscles, CTAB, no wheezes, rales or rhonchi Cardiovascular: RRR, No r/g/m, no peripheral edema Abdomen: BS present, soft, nontender,nondistended. Neuro:  A&Ox3, CN II-XII intact, normal gait Skin:  Warm, dry, intact.  See GU. Psych: normal affect, mood appropriate, tearful at times GU: Normal external female genitalia.  A small 5 mm erosion present on patient's right lower medial labia majora.  No roots present on the lesion  No results found for this or any previous visit (from the past 2160 hour(s)).  Assessment/Plan: Routine screening for STI (sexually transmitted infection)  -trying to obtain  records from recent UC visit as told may have herpes.  Phone call placed to the office and records release form obtained. -Discussed safe sex practices. - Plan: RPR, Viral culture, C. trachomatis/N. gonorrhoeae RNA  Screening for cholesterol level  - Plan: Lipid panel  Encounter to establish care  -We reviewed the PMH, PSH, FH, SH, Meds and Allergies. -We provided refills for any medications we will prescribe as needed. -We addressed current concerns per orders and patient instructions. -We have asked for records for pertinent exams, studies, vaccines and notes from previous providers. -We have advised patient to follow up per instructions below. - Plan: Comprehensive metabolic panel, CBC with Differential/Platelet, CBC with Differential/Platelet  Anxiety and depression  -PHQ 9 score 12 -GAD 7 score 11 -pt encouraged to attend counseling.  Given a list of area providers. - Plan: TSH, T4, free, escitalopram (LEXAPRO) 10 MG tablet  Screening for HIV (human immunodeficiency virus)  - Plan: HIV antibody (with reflex)   Pt to f/u in 2 wks.  Will also need to schedule CPE/Pap at some point.   Grier Mitts, MD

## 2017-11-22 LAB — RPR: RPR Ser Ql: NONREACTIVE

## 2017-11-22 LAB — C. TRACHOMATIS/N. GONORRHOEAE RNA
C. trachomatis RNA, TMA: NOT DETECTED
N. gonorrhoeae RNA, TMA: NOT DETECTED

## 2017-11-22 LAB — HIV ANTIBODY (ROUTINE TESTING W REFLEX): HIV 1&2 Ab, 4th Generation: NONREACTIVE

## 2017-11-25 ENCOUNTER — Encounter: Payer: Self-pay | Admitting: Family Medicine

## 2017-11-25 ENCOUNTER — Telehealth: Payer: Self-pay | Admitting: Family Medicine

## 2017-11-25 NOTE — Telephone Encounter (Signed)
Copied from [place] [id number]. Topic: Inquiry >> Nov 25, 2017  9:34 AM Boyd Kerbs wrote: Reason for CRM:   Pt. Calling for results.

## 2017-11-25 NOTE — Telephone Encounter (Signed)
Informed pt. Results were not ready and she will be contacted once they were reviewed. Verbalizes understanding.

## 2017-11-28 ENCOUNTER — Encounter: Payer: Self-pay | Admitting: Family Medicine

## 2017-12-05 ENCOUNTER — Ambulatory Visit (INDEPENDENT_AMBULATORY_CARE_PROVIDER_SITE_OTHER): Payer: 59 | Admitting: Family Medicine

## 2017-12-05 ENCOUNTER — Encounter: Payer: Self-pay | Admitting: Family Medicine

## 2017-12-05 VITALS — BP 130/88 | HR 66 | Temp 98.6°F | Wt 250.0 lb

## 2017-12-05 DIAGNOSIS — F32A Depression, unspecified: Secondary | ICD-10-CM

## 2017-12-05 DIAGNOSIS — F419 Anxiety disorder, unspecified: Secondary | ICD-10-CM | POA: Diagnosis not present

## 2017-12-05 DIAGNOSIS — N809 Endometriosis, unspecified: Secondary | ICD-10-CM | POA: Diagnosis not present

## 2017-12-05 DIAGNOSIS — F329 Major depressive disorder, single episode, unspecified: Secondary | ICD-10-CM | POA: Diagnosis not present

## 2017-12-05 NOTE — Progress Notes (Signed)
Subjective:    Patient ID: Rebecca Watson, female    DOB: July 17, 1981, 37 y.o.   MRN: [id number]  Chief Complaint  Patient presents with  . Follow-up    HPI Patient was seen today for f/u.  Pt was seen on 11/21/17 for concern about STI, anxiety, and depression.  Pt was started on Lexapro 10 mg daily.  Pt states hearing her negative test results helped ease her anxiety some.  If this provider has still not received information about test results from patient's previous provider.  Pt also switched to working third shift and has agreed to see her father 3 times a week instead of daily to help care for him status post stroke.  Pt states that she is working on making some changes in her life.  She states she cannot make a list of things she would like to change as that would only increase her anxiety.  Pt states she hopes to look into counseling after she has made some of these changes.  Pt does endorse slightly improved mood given the warm weather.  Pt denies SI/HI.Marland Kitchen  Past Medical History:  Diagnosis Date  . Allergy   . Diverticulitis   . Diverticulitis   . Diverticulosis   . GERD (gastroesophageal reflux disease)   . Obesity   . Thyroid disease     Allergies  Allergen Reactions  . Flagyl [Metronidazole]     Vomiting     ROS General: Denies fever, chills, night sweats, changes in weight, changes in appetite HEENT: Denies headaches, ear pain, changes in vision, rhinorrhea, sore throat CV: Denies CP, palpitations, SOB, orthopnea Pulm: Denies SOB, cough, wheezing GI: Denies abdominal pain, nausea, vomiting, diarrhea, constipation GU: Denies dysuria, hematuria, frequency, vaginal discharge Msk: Denies muscle cramps, joint pains Neuro: Denies weakness, numbness, tingling Skin: Denies rashes, bruising Psych: Denies hallucinations  + anxiety, depression     Objective:    Blood pressure 130/88, pulse 66, temperature 98.6 F (37 C), temperature source Oral, weight 250 lb (113.4 kg),  last menstrual period 12/05/2017, SpO2 97 %.   Gen. Pleasant, well-nourished, in no distress, normal affect   HEENT: [place]/AT, face symmetric, conjunctiva clear, no scleral icterus, PERRLA, nares patent without drainage Lungs: no accessory muscle use, CTAB, no wheezes or rales Cardiovascular: RRR, no m/r/g, no peripheral edema Abdomen: BS present, soft, NT/ND Neuro:  A&Ox3, CN II-XII intact, normal gait    Wt Readings from Last 3 Encounters:  12/05/17 250 lb (113.4 kg)  11/21/17 255 lb (115.7 kg)  04/19/16 266 lb (120.7 kg)    Lab Results  Component Value Date   WBC 6.3 11/21/2017   HGB 12.2 11/21/2017   HCT 38.0 11/21/2017   PLT 288.0 11/21/2017   GLUCOSE 80 11/21/2017   CHOL 183 11/21/2017   TRIG 75.0 11/21/2017   HDL 60.80 11/21/2017   LDLCALC 107 (H) 11/21/2017   ALT 6 11/21/2017   AST 10 11/21/2017   NA 137 11/21/2017   K 3.9 11/21/2017   CL 106 11/21/2017   CREATININE 0.80 11/21/2017   BUN 12 11/21/2017   CO2 24 11/21/2017   TSH 0.68 11/21/2017    Assessment/Plan:  Anxiety and depression   -PHQ 9 score 14.  Was 12 during last visit -Patient encouraged again to consider counseling. -Continue Lexapro 10 mg daily -We will follow-up in 1 month to assess if Lexapro needs to be increased.  Grier Mitts, MD

## 2017-12-17 LAB — VIRAL CULTURE VIRC
MICRO NUMBER:: 90102526
SPECIMEN QUALITY:: ADEQUATE

## 2018-01-02 ENCOUNTER — Ambulatory Visit: Payer: Self-pay | Admitting: Family Medicine

## 2018-01-06 ENCOUNTER — Encounter: Payer: Self-pay | Admitting: Family Medicine

## 2018-01-15 ENCOUNTER — Other Ambulatory Visit: Payer: Self-pay | Admitting: Family Medicine

## 2018-01-15 DIAGNOSIS — F329 Major depressive disorder, single episode, unspecified: Secondary | ICD-10-CM

## 2018-01-15 DIAGNOSIS — F32A Depression, unspecified: Secondary | ICD-10-CM

## 2018-01-15 DIAGNOSIS — F419 Anxiety disorder, unspecified: Principal | ICD-10-CM

## 2018-01-15 MED ORDER — ESCITALOPRAM OXALATE 20 MG PO TABS: 20.0000 mg | ORAL_TABLET | Freq: Every day | ORAL | 3 refills | Status: DC

## 2018-07-12 ENCOUNTER — Other Ambulatory Visit: Payer: Self-pay

## 2018-07-12 ENCOUNTER — Emergency Department (HOSPITAL_COMMUNITY): Payer: 59

## 2018-07-12 ENCOUNTER — Emergency Department (HOSPITAL_COMMUNITY)
Admission: EM | Admit: 2018-07-12 | Discharge: 2018-07-12 | Disposition: A | Discharge status: Home / Self Care | Payer: 59 | Attending: Emergency Medicine | Admitting: Emergency Medicine

## 2018-07-12 ENCOUNTER — Encounter (HOSPITAL_COMMUNITY): Payer: Self-pay | Admitting: *Deleted

## 2018-07-12 DIAGNOSIS — W010XXA Fall on same level from slipping, tripping and stumbling without subsequent striking against object, initial encounter: Secondary | ICD-10-CM | POA: Diagnosis not present

## 2018-07-12 DIAGNOSIS — M79645 Pain in left finger(s): Secondary | ICD-10-CM | POA: Insufficient documentation

## 2018-07-12 DIAGNOSIS — M25562 Pain in left knee: Secondary | ICD-10-CM | POA: Insufficient documentation

## 2018-07-12 DIAGNOSIS — F17210 Nicotine dependence, cigarettes, uncomplicated: Secondary | ICD-10-CM | POA: Diagnosis not present

## 2018-07-12 DIAGNOSIS — M7989 Other specified soft tissue disorders: Secondary | ICD-10-CM | POA: Diagnosis not present

## 2018-07-12 DIAGNOSIS — M25462 Effusion, left knee: Secondary | ICD-10-CM | POA: Diagnosis not present

## 2018-07-12 DIAGNOSIS — Z79899 Other long term (current) drug therapy: Secondary | ICD-10-CM | POA: Insufficient documentation

## 2018-07-12 DIAGNOSIS — S6992XA Unspecified injury of left wrist, hand and finger(s), initial encounter: Secondary | ICD-10-CM | POA: Diagnosis not present

## 2018-07-12 MED ORDER — DICLOFENAC SODIUM 1 % TD GEL: 2.0000 g | Freq: Four times a day (QID) | TRANSDERMAL | 0 refills | Status: DC

## 2018-07-12 MED ORDER — NAPROXEN 375 MG PO TABS: 375.0000 mg | ORAL_TABLET | Freq: Two times a day (BID) | ORAL | 0 refills | Status: DC

## 2018-07-12 NOTE — Progress Notes (Signed)
Orthopedic Tech Progress Note Patient Details:  Rebecca Watson 09-Feb-1981 [id number]  Ortho Devices Type of Ortho Device: Finger splint Ortho Device/Splint Location: lue Ortho Device/Splint Interventions: Application   Post Interventions Patient Tolerated: Well Instructions Provided: Care of device   [name] 07/12/2018, 9:37 AM

## 2018-07-12 NOTE — ED Triage Notes (Signed)
Pt in c/o left knee pain and left pointer finger pain after a fall a few days ago, pain worse with ambulation and movement

## 2018-07-12 NOTE — ED Provider Notes (Signed)
[facility] EMERGENCY DEPARTMENT Provider Note   CSN: [id number] Arrival date & time: 07/12/18  7829     History   Chief Complaint Chief Complaint  Patient presents with  . Fall    HPI Rebecca Watson is a 37 y.o. female.  Patient is a 37 year old female who presents with knee pain after a fall.  She states that 3 days ago her left knee gave out and she fell down on it.  She is had some prior issues with that knee before.  She states is been hurting since that time and intermittently swelling.  She states it hurts to walk on it.  She also says while she was trying to catch herself she injured her left index finger which is also been swollen.  She denies any head injury with the fall.  No neck or back pain.  No other injuries.  She has been using over-the-counter medications without improvement in symptoms.     Past Medical History:  Diagnosis Date  . Allergy   . Diverticulitis   . Diverticulitis   . Diverticulosis   . GERD (gastroesophageal reflux disease)   . Obesity   . Thyroid disease     Patient Active Problem List   Diagnosis Date Noted  . Endometriosis determined by laparoscopy 12/05/2017    Past Surgical History:  Procedure Laterality Date  . CESAREAN SECTION    . CESAREAN SECTION    . TUBAL LIGATION    . TUBAL LIGATION       OB History   None      Home Medications    Prior to Admission medications   Medication Sig Start Date End Date Taking? Authorizing Provider  acetaminophen (TYLENOL) 500 MG tablet Take 1,000 mg by mouth every 6 (six) hours as needed for moderate pain or headache.   Yes [provider]  diclofenac sodium (VOLTAREN) 1 % GEL Apply 2 g topically 4 (four) times daily.   Yes [provider]  escitalopram (LEXAPRO) 20 MG tablet Take 1 tablet (20 mg total) by mouth daily. 01/15/18  Yes Billie Ruddy, MD  HYDROcodone-acetaminophen (NORCO/VICODIN) 5-325 MG tablet Take 1 tablet by mouth every 6 (six)  hours as needed for moderate pain.   Yes [provider]  cyclobenzaprine (FLEXERIL) 10 MG tablet Take 1 tablet (10 mg total) by mouth 2 (two) times daily as needed for muscle spasms. Patient not taking: Reported on 12/05/2017 09/14/15   Nona Dell, PA-C  naproxen (NAPROSYN) 375 MG tablet Take 1 tablet (375 mg total) by mouth 2 (two) times daily. 07/12/18   Malvin Johns, MD    Family History History reviewed. No pertinent family history.  Social History Social History   Tobacco Use  . Smoking status: Current Every Day Smoker    Types: Cigarettes  . Smokeless tobacco: Never Used  Substance Use Topics  . Alcohol use: No  . Drug use: Yes    Types: Marijuana     Allergies   Doxycycline   Review of Systems Review of Systems  Constitutional: Negative for fever.  Gastrointestinal: Negative for nausea and vomiting.  Musculoskeletal: Positive for arthralgias and joint swelling. Negative for back pain and neck pain.  Skin: Negative for wound.  Neurological: Negative for weakness, numbness and headaches.     Physical Exam Updated Vital Signs BP (!) 132/97 (BP Location: Right Arm)   Pulse 69   Temp 97.9 F (36.6 C) (Oral)   Resp 18  LMP 07/09/2018   SpO2 98%   Physical Exam  Constitutional: She is oriented to person, place, and time. She appears well-developed and well-nourished.  HENT:  Head: Normocephalic and atraumatic.  Neck: Normal range of motion. Neck supple.  Cardiovascular: Normal rate.  Pulmonary/Chest: Effort normal.  Musculoskeletal: She exhibits edema and tenderness.  Patient has some generalized tenderness to the left knee.  There is pain on range of motion of the knee.  There is slight effusion.  She is able to do a straight leg raise.  Ligaments are grossly intact.  She has normal motor function and sensation distally.  There is no pain to the ankle or the hip.  She does have some swelling over the left index finger with pain at the  base of the finger.  She has limited range of motion but normal strength in the finger.  She has normal sensation and capillary refill distally.  Neurological: She is alert and oriented to person, place, and time.  Skin: Skin is warm and dry.  Psychiatric: She has a normal mood and affect.     ED Treatments / Results  Labs (all labs ordered are listed, but only abnormal results are displayed) Labs Reviewed - No data to display  EKG None  Radiology Dg Knee Complete 4 Views Left  Result Date: 07/12/2018 CLINICAL DATA:  Fall 2 days ago with knee pain, initial encounter EXAM: LEFT KNEE - COMPLETE 4+ VIEW COMPARISON:  02/11/2004 FINDINGS: Minimal joint effusion is seen. No acute fracture or dislocation is noted. No other soft tissue abnormality is noted. IMPRESSION: Minimal effusion without acute bony abnormality. Electronically Signed   By: Inez Catalina M.D.   On: 07/12/2018 08:32   Dg Finger Index Left  Result Date: 07/12/2018 CLINICAL DATA:  Recent fall with second digit pain, initial encounter EXAM: LEFT INDEX FINGER 2+V COMPARISON:  None. FINDINGS: Mild soft tissue swelling of the second digit is noted. No acute fracture or dislocation is noted. IMPRESSION: Soft tissue swelling without acute bony abnormality. Electronically Signed   By: Inez Catalina M.D.   On: 07/12/2018 08:32    Procedures Procedures (including critical care time)  Medications Ordered in ED Medications - No data to display   Initial Impression / Assessment and Plan / ED Course  I have reviewed the triage vital signs and the nursing notes.  Pertinent labs & imaging results that were available during my care of the patient were reviewed by me and considered in my medical decision making (see chart for details).     Patient is a 37 year old female who presents with knee pain as well as finger pain.  There is no evidence of bony injuries.  She likely has sprain.  She was placed in a knee sleeve and was placed in a  finger splint.  She was advised in ice and elevation.  She was given a prescription for Naprosyn to use for pain control.  She was advised not to use ibuprofen while she is using Naprosyn.  She was given a referral to follow-up with orthopedics.  Return precautions were given.  Final Clinical Impressions(s) / ED Diagnoses   Final diagnoses:  Acute pain of left knee    ED Discharge Orders         Ordered    naproxen (NAPROSYN) 375 MG tablet  2 times daily     07/12/18 0855           Malvin Johns, MD 07/12/18 [phone]

## 2018-07-12 NOTE — Progress Notes (Signed)
Orthopedic Tech Progress Note Patient Details:  Rebecca Watson April 24, 1981 [id number]  Ortho Devices Type of Ortho Device: Finger splint Ortho Device/Splint Location: lue Ortho Device/Splint Interventions: Application   Post Interventions Patient Tolerated: Well Instructions Provided: Care of device Nursing staff provided knee sleeve  [name] 07/12/2018, 9:37 AM

## 2018-07-14 ENCOUNTER — Telehealth (INDEPENDENT_AMBULATORY_CARE_PROVIDER_SITE_OTHER): Payer: Self-pay | Admitting: Orthopaedic Surgery

## 2018-07-14 NOTE — Telephone Encounter (Signed)
Patient was was seen in the ER on the 19th of September for Acute pain of left knee. I scheduled patient for the 23rd of September. I wanted to make sure patient will be ok to wait until Monday. Discharge summary states Asap I saw this after I hung up the call.

## 2018-07-14 NOTE — Telephone Encounter (Signed)
Ok

## 2018-07-14 NOTE — Telephone Encounter (Signed)
That is fine, ER ALWAYS says asap

## 2018-07-16 ENCOUNTER — Ambulatory Visit (INDEPENDENT_AMBULATORY_CARE_PROVIDER_SITE_OTHER): Payer: 59 | Admitting: Family Medicine

## 2018-07-16 ENCOUNTER — Encounter: Payer: Self-pay | Admitting: Family Medicine

## 2018-07-16 VITALS — BP 100/80 | HR 78 | Temp 98.6°F | Ht 65.0 in | Wt 221.0 lb

## 2018-07-16 DIAGNOSIS — T148XXA Other injury of unspecified body region, initial encounter: Secondary | ICD-10-CM | POA: Diagnosis not present

## 2018-07-16 DIAGNOSIS — Z0000 Encounter for general adult medical examination without abnormal findings: Secondary | ICD-10-CM | POA: Diagnosis not present

## 2018-07-16 MED ORDER — CYCLOBENZAPRINE HCL 5 MG PO TABS: 5.0000 mg | ORAL_TABLET | Freq: Three times a day (TID) | ORAL | 0 refills | Status: DC | PRN

## 2018-07-16 NOTE — Patient Instructions (Addendum)
Preventive Care 18-39 Years, Female Preventive care refers to lifestyle choices and visits with your health care provider that can promote health and wellness. What does preventive care include?  A yearly physical exam. This is also called an annual well check.  Dental exams once or twice a year.  Routine eye exams. Ask your health care provider how often you should have your eyes checked.  Personal lifestyle choices, including: ? Daily care of your teeth and gums. ? Regular physical activity. ? Eating a healthy diet. ? Avoiding tobacco and drug use. ? Limiting alcohol use. ? Practicing safe sex. ? Taking vitamin and mineral supplements as recommended by your health care provider. What happens during an annual well check? The services and screenings done by your health care provider during your annual well check will depend on your age, overall health, lifestyle risk factors, and family history of disease. Counseling Your health care provider may ask you questions about your:  Alcohol use.  Tobacco use.  Drug use.  Emotional well-being.  Home and relationship well-being.  Sexual activity.  Eating habits.  Work and work Statistician.  Method of birth control.  Menstrual cycle.  Pregnancy history.  Screening You may have the following tests or measurements:  Height, weight, and BMI.  Diabetes screening. This is done by checking your blood sugar (glucose) after you have not eaten for a while (fasting).  Blood pressure.  Lipid and cholesterol levels. These may be checked every 5 years starting at age 66.  Skin check.  Hepatitis C blood test.  Hepatitis B blood test.  Sexually transmitted disease (STD) testing.  BRCA-related cancer screening. This may be done if you have a family history of breast, ovarian, tubal, or peritoneal cancers.  Pelvic exam and Pap test. This may be done every 3 years starting at age 40. Starting at age 59, this may be done every 5  years if you have a Pap test in combination with an HPV test.  Discuss your test results, treatment options, and if necessary, the need for more tests with your health care provider. Vaccines Your health care provider may recommend certain vaccines, such as:  Influenza vaccine. This is recommended every year.  Tetanus, diphtheria, and acellular pertussis (Tdap, Td) vaccine. You may need a Td booster every 10 years.  Varicella vaccine. You may need this if you have not been vaccinated.  HPV vaccine. If you are 69 or younger, you may need three doses over 6 months.  Measles, mumps, and rubella (MMR) vaccine. You may need at least one dose of MMR. You may also need a second dose.  Pneumococcal 13-valent conjugate (PCV13) vaccine. You may need this if you have certain conditions and were not previously vaccinated.  Pneumococcal polysaccharide (PPSV23) vaccine. You may need one or two doses if you smoke cigarettes or if you have certain conditions.  Meningococcal vaccine. One dose is recommended if you are age 27-21 years and a first-year college student living in a residence hall, or if you have one of several medical conditions. You may also need additional booster doses.  Hepatitis A vaccine. You may need this if you have certain conditions or if you travel or work in places where you may be exposed to hepatitis A.  Hepatitis B vaccine. You may need this if you have certain conditions or if you travel or work in places where you may be exposed to hepatitis B.  Haemophilus influenzae type b (Hib) vaccine. You may need this if  you have certain risk factors.  Talk to your health care provider about which screenings and vaccines you need and how often you need them. This information is not intended to replace advice given to you by your health care provider. Make sure you discuss any questions you have with your health care provider. Document Released: 12/11/2001 Document Revised: 07/04/2016  Document Reviewed: 08/16/2015 Elsevier Interactive Patient Education  2018 [place] 18-39 Years, Female Preventive care refers to lifestyle choices and visits with your health care provider that can promote health and wellness. What does preventive care include?  A yearly physical exam. This is also called an annual well check.  Dental exams once or twice a year.  Routine eye exams. Ask your health care provider how often you should have your eyes checked.  Personal lifestyle choices, including: ? Daily care of your teeth and gums. ? Regular physical activity. ? Eating a healthy diet. ? Avoiding tobacco and drug use. ? Limiting alcohol use. ? Practicing safe sex. ? Taking vitamin and mineral supplements as recommended by your health care provider. What happens during an annual well check? The services and screenings done by your health care provider during your annual well check will depend on your age, overall health, lifestyle risk factors, and family history of disease. Counseling Your health care provider may ask you questions about your:  Alcohol use.  Tobacco use.  Drug use.  Emotional well-being.  Home and relationship well-being.  Sexual activity.  Eating habits.  Work and work Statistician.  Method of birth control.  Menstrual cycle.  Pregnancy history.  Screening You may have the following tests or measurements:  Height, weight, and BMI.  Diabetes screening. This is done by checking your blood sugar (glucose) after you have not eaten for a while (fasting).  Blood pressure.  Lipid and cholesterol levels. These may be checked every 5 years starting at age 76.  Skin check.  Hepatitis C blood test.  Hepatitis B blood test.  Sexually transmitted disease (STD) testing.  BRCA-related cancer screening. This may be done if you have a family history of breast, ovarian, tubal, or peritoneal cancers.  Pelvic exam and Pap test.  This may be done every 3 years starting at age 46. Starting at age 55, this may be done every 5 years if you have a Pap test in combination with an HPV test.  Discuss your test results, treatment options, and if necessary, the need for more tests with your health care provider. Vaccines Your health care provider may recommend certain vaccines, such as:  Influenza vaccine. This is recommended every year.  Tetanus, diphtheria, and acellular pertussis (Tdap, Td) vaccine. You may need a Td booster every 10 years.  Varicella vaccine. You may need this if you have not been vaccinated.  HPV vaccine. If you are 2 or younger, you may need three doses over 6 months.  Measles, mumps, and rubella (MMR) vaccine. You may need at least one dose of MMR. You may also need a second dose.  Pneumococcal 13-valent conjugate (PCV13) vaccine. You may need this if you have certain conditions and were not previously vaccinated.  Pneumococcal polysaccharide (PPSV23) vaccine. You may need one or two doses if you smoke cigarettes or if you have certain conditions.  Meningococcal vaccine. One dose is recommended if you are age 53-21 years and a first-year college student living in a residence hall, or if you have one of several medical conditions. You may also need  additional booster doses.  Hepatitis A vaccine. You may need this if you have certain conditions or if you travel or work in places where you may be exposed to hepatitis A.  Hepatitis B vaccine. You may need this if you have certain conditions or if you travel or work in places where you may be exposed to hepatitis B.  Haemophilus influenzae type b (Hib) vaccine. You may need this if you have certain risk factors.  Talk to your health care provider about which screenings and vaccines you need and how often you need them. This information is not intended to replace advice given to you by your health care provider. Make sure you discuss any questions you  have with your health care provider. Document Released: 12/11/2001 Document Revised: 07/04/2016 Document Reviewed: 08/16/2015 Elsevier Interactive Patient Education  2018 Else[employer] Inc[employer]  

## 2018-07-16 NOTE — Progress Notes (Signed)
Subjective:     Rebecca Watson is a 37 y.o. female and is here for a comprehensive physical exam. The patient reports problems - recent fall.  Pt fell on L knee 9/11, after it "gave out" on her.  Pt also injured her L index finger in the fall, swollen and painful.  She went to the ED on 9/14, thought to have sprain.  Given knee sleeve, finger splint, Naprosyn, advised to f/u with ortho.  Pt with continued L finger pain and L knee pain (h/o chronic R knee pain).  Is an Therapist, sports at [employer], was unable to go to work 2/2 the pain.  Tried muscle relaxer which helped.  Is planning to f/u with ortho.  Will get influenza vaccine at work.  Social History   Socioeconomic History  . Marital status: Single    Spouse name: Not on file  . Number of children: Not on file  . Years of education: Not on file  . Highest education level: Not on file  Occupational History  . Not on file  Social Needs  . Financial resource strain: Not on file  . Food insecurity:    Worry: Not on file    Inability: Not on file  . Transportation needs:    Medical: Not on file    Non-medical: Not on file  Tobacco Use  . Smoking status: Current Every Day Smoker    Types: Cigarettes  . Smokeless tobacco: Never Used  Substance and Sexual Activity  . Alcohol use: No  . Drug use: Yes    Types: Marijuana  . Sexual activity: Yes    Birth control/protection: None, Surgical  Lifestyle  . Physical activity:    Days per week: Not on file    Minutes per session: Not on file  . Stress: Not on file  Relationships  . Social connections:    Talks on phone: Not on file    Gets together: Not on file    Attends religious service: Not on file    Active member of club or organization: Not on file    Attends meetings of clubs or organizations: Not on file    Relationship status: Not on file  . Intimate partner violence:    Fear of current or ex partner: Not on file    Emotionally abused: Not on file    Physically abused: Not on file   Forced sexual activity: Not on file  Other Topics Concern  . Not on file  Social History Narrative  . Not on file   Health Maintenance  Topic Date Due  . INFLUENZA VACCINE  05/29/2018  . PAP SMEAR  06/22/2019  . TETANUS/TDAP  06/21/2026  . HIV Screening  Completed    The following portions of the patient's history were reviewed and updated as appropriate: allergies, current medications, past family history, past medical history, past social history, past surgical history and problem list.  Review of Systems A comprehensive review of systems was negative.   Objective:    BP 100/80 (BP Location: Left Arm, Patient Position: Sitting, Cuff Size: Large)   Pulse 78   Temp 98.6 F (37 C) (Oral)   Ht 5\' 5"  (1.651 m)   Wt 221 lb (100.2 kg)   LMP 07/09/2018   SpO2 98%   BMI 36.78 kg/m  General appearance: alert, cooperative and no distress Head: Normocephalic, without obvious abnormality, atraumatic Eyes: conjunctivae/corneas clear. PERRL, EOM's intact. Fundi benign. Ears: normal TM's and external ear canals both ears  Nose: Nares normal. Septum midline. Mucosa normal. No drainage or sinus tenderness. Throat: lips, mucosa, and tongue normal; teeth and gums normal Neck: no adenopathy, no carotid bruit, no JVD, supple, symmetrical, trachea midline and thyroid not enlarged, symmetric, no tenderness/mass/nodules Lungs: clear to auscultation bilaterally Heart: regular rate and rhythm, S1, S2 normal, no murmur, click, rub or gallop Abdomen: soft, non-tender; bowel sounds normal; no masses,  no organomegaly Extremities: L index finger with edema, TTP of finger, limited ROM 2/2 edema, no erythema.  L knee without effusion, no edema, TTP of L knee inferior to patella. Skin: Skin color, texture, turgor normal. No rashes or lesions Neurologic: Alert and oriented X 3, normal strength and tone. Normal symmetric reflexes. Normal coordination and gait    Assessment:    Healthy female exam. Recent  fall with L knee and L index finger sprain.     Plan:     Anticipatory guidance given including wearing seatbelts, smoke detectors in the home, increasing physical activity, increasing p.o. intake of water and vegetables. -CMP, Lipids, CBC, TSH, and free T4 up to date, done 1/19 -will get influenza vaccine at work -pap up to date.  Due next yr. -given handout See After Visit Summary for Counseling Recommendations    L knee and L finger sprain -discussed ice, rest, NSAIDs -given refill of flexeril as helped with discomfort. -offered xray, pt declined given upcoming ortho appt.  F/u prn  Grier Mitts, MD

## 2018-07-19 ENCOUNTER — Encounter: Payer: Self-pay | Admitting: Family Medicine

## 2018-07-21 ENCOUNTER — Ambulatory Visit (INDEPENDENT_AMBULATORY_CARE_PROVIDER_SITE_OTHER): Payer: 59

## 2018-07-21 ENCOUNTER — Encounter (INDEPENDENT_AMBULATORY_CARE_PROVIDER_SITE_OTHER): Payer: Self-pay | Admitting: Orthopaedic Surgery

## 2018-07-21 ENCOUNTER — Ambulatory Visit (INDEPENDENT_AMBULATORY_CARE_PROVIDER_SITE_OTHER): Payer: 59 | Admitting: Orthopaedic Surgery

## 2018-07-21 VITALS — Ht 66.0 in | Wt 217.0 lb

## 2018-07-21 DIAGNOSIS — M25562 Pain in left knee: Secondary | ICD-10-CM

## 2018-07-21 DIAGNOSIS — M25561 Pain in right knee: Secondary | ICD-10-CM

## 2018-07-21 DIAGNOSIS — G8929 Other chronic pain: Secondary | ICD-10-CM

## 2018-07-21 MED ORDER — LIDOCAINE HCL 1 % IJ SOLN
3.0000 mL | INTRAMUSCULAR | Status: AC | PRN
  Administered 2018-07-21: 3 mL

## 2018-07-21 MED ORDER — METHYLPREDNISOLONE ACETATE 40 MG/ML IJ SUSP
40.0000 mg | INTRAMUSCULAR | Status: AC | PRN
  Administered 2018-07-21: 40 mg via INTRA_ARTICULAR

## 2018-07-21 NOTE — Progress Notes (Signed)
Office Visit Note   Patient: Rebecca Watson           Date of Birth: 09/30/1981           MRN: [id number] Visit Date: 07/21/2018              Requested by: Billie Ruddy, MD [place], [place] [address] PCP: Billie Ruddy, MD   Assessment & Plan: Visit Diagnoses:  1. Chronic pain of right knee   2. Chronic pain of both knees     Plan: Given her significant guarding with her right knee and her inability to fully extend it as well as the severe pain with flexion, I am suspicious for a meniscal flap tear.  I did place a steroid injection in her knee today to help temporize her symptoms and will put her in a knee brace which will be hinged knee brace however I do feel it is necessary at this point to obtain an MRI of her right knee given the severity of her pain and mechanical symptoms and given the fact that now she is having falls injuring other parts of her body.  She agrees with this assessment and plan as well.  She tolerated the steroid injection well.  We will see her back in 2 weeks to go over the MRI of her right knee.  All question concerns were answered and addressed.  Follow-Up Instructions: Return in about 2 weeks (around 08/04/2018).   Orders:  Orders Placed This Encounter  Procedures  . Large Joint Inj: R knee  . XR KNEE 3 VIEW RIGHT  . MR Knee Right w/o contrast   No orders of the defined types were placed in this encounter.     Procedures: Large Joint Inj: R knee on 07/21/2018 4:46 PM Indications: diagnostic evaluation and pain Details: 22 G 1.5 in needle, superolateral approach  Arthrogram: No  Medications: 3 mL lidocaine 1 %; 40 mg methylPREDNISolone acetate 40 MG/ML Outcome: tolerated well, no immediate complications Procedure, treatment alternatives, risks and benefits explained, specific risks discussed. Consent was given by the patient. Immediately prior to procedure a time out was called to verify the correct patient, procedure,  equipment, support staff and site/side marked as required. Patient was prepped and draped in the usual sterile fashion.       Clinical Data: No additional findings.   Subjective: Chief Complaint  Patient presents with  . Left Knee - Pain  . 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  She has been dealing with right knee pain for several years now.  The knee pops and locks and catches on her.  It does not fully extend the way her another knee extends in terms of hyperextension.  It is been unstable to her and she is recently had some mechanical falls injuring her left knee and her left hand due to dealing with her right knee.  She had actually go to the emergency room recently due to this.  She does take naproxen but it does cause gastritis when she takes too much of an anti-inflammatory.  She is tried pain medication as well as muscle relaxant and topical anti-inflammatories using Voltaren gel.  Given the continued knee pain with locking catching of her right knee and her continued falls with instability symptoms she is come in for evaluation treatment of her right knee.  HPI  Review  of Systems She currently denies any headache, chest pain, shortness of breath, fever, chills, nausea, vomiting.  Objective: Vital Signs: Ht 5\' 6"  (1.676 m)   Wt 217 lb (98.4 kg)   LMP 07/09/2018   BMI 35.02 kg/m   Physical Exam She is alert and oriented x3 and in no acute distress she is walking with a limp. Ortho Exam Examination of her left knee is normal examination of her right knee shows that it does lack full extension in terms of her left knee hyperextend but her right knee does not.  Her right knee does have a mild effusion.  She has slight valgus malalignment of both knees.  Her left knee is ligamentously stable and I can flex her all the way back easily.  Her right knee she has significant pain and guarding when I start  trying to flex her beyond 90 degrees.  She does have a positive Murray sign to the lateral compartment of the knee. Specialty Comments:  No specialty comments available.  Imaging: Xr Knee 3 View Right  Result Date: 07/21/2018 3 views of the right knee show well-maintained joint space with no acute findings.    PMFS History: Patient Active Problem List   Diagnosis Date Noted  . Endometriosis determined by laparoscopy 12/05/2017   Past Medical History:  Diagnosis Date  . Allergy   . Diverticulitis   . Diverticulitis   . Diverticulosis   . GERD (gastroesophageal reflux disease)   . Obesity   . Thyroid disease     History reviewed. No pertinent family history.  Past Surgical History:  Procedure Laterality Date  . CESAREAN SECTION    . CESAREAN SECTION    . TUBAL LIGATION    . TUBAL LIGATION     Social History   Occupational History  . Not on file  Tobacco Use  . Smoking status: Current Every Day Smoker    Types: Cigarettes  . Smokeless tobacco: Never Used  Substance and Sexual Activity  . Alcohol use: No  . Drug use: Yes    Types: Marijuana  . Sexual activity: Yes    Birth control/protection: None, Surgical

## 2018-08-03 ENCOUNTER — Ambulatory Visit
Admission: RE | Admit: 2018-08-03 | Discharge: 2018-08-03 | Disposition: A | Discharge status: Home / Self Care | Payer: 59 | Source: Ambulatory Visit | Attending: Orthopaedic Surgery | Admitting: Orthopaedic Surgery

## 2018-08-03 DIAGNOSIS — M23341 Other meniscus derangements, anterior horn of lateral meniscus, right knee: Secondary | ICD-10-CM | POA: Diagnosis not present

## 2018-08-03 DIAGNOSIS — M25562 Pain in left knee: Principal | ICD-10-CM

## 2018-08-03 DIAGNOSIS — M25561 Pain in right knee: Principal | ICD-10-CM

## 2018-08-03 DIAGNOSIS — G8929 Other chronic pain: Secondary | ICD-10-CM

## 2018-08-04 ENCOUNTER — Encounter (INDEPENDENT_AMBULATORY_CARE_PROVIDER_SITE_OTHER): Payer: Self-pay | Admitting: Orthopaedic Surgery

## 2018-08-04 ENCOUNTER — Ambulatory Visit (INDEPENDENT_AMBULATORY_CARE_PROVIDER_SITE_OTHER): Payer: 59 | Admitting: Orthopaedic Surgery

## 2018-08-04 VITALS — Ht 66.0 in | Wt 214.0 lb

## 2018-08-04 DIAGNOSIS — S83271D Complex tear of lateral meniscus, current injury, right knee, subsequent encounter: Secondary | ICD-10-CM

## 2018-08-04 DIAGNOSIS — G8929 Other chronic pain: Secondary | ICD-10-CM | POA: Diagnosis not present

## 2018-08-04 DIAGNOSIS — M1711 Unilateral primary osteoarthritis, right knee: Secondary | ICD-10-CM

## 2018-08-04 DIAGNOSIS — M25561 Pain in right knee: Secondary | ICD-10-CM | POA: Diagnosis not present

## 2018-08-04 DIAGNOSIS — S83271A Complex tear of lateral meniscus, current injury, right knee, initial encounter: Secondary | ICD-10-CM | POA: Insufficient documentation

## 2018-08-04 NOTE — Progress Notes (Signed)
The patient comes in today for follow-up after having an MRI of her right knee.  We did place a steroid injection in her knee on 07/21/2018 and she did say that gave her some relief and she is not limping as bad.  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  She said her job is certainly concerned about her knee pain and the potential for her to drop a patient if she gets acute onset of pain.  Is mainly lateral aspect of her knee that hurts.  Her plain films showed some slight valgus malalignment but still well-maintained joint space of center for an MRI for evaluation treatment of an internal derangement of the knee.  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  There is maceration of the lateral meniscus from the anterior horn to the mid body with meniscal tearing and some extrusion of the lateral meniscus.  The medial side shows just some capsular tearing of the posterior aspect with no meniscal tear and the cartilage is preserved.  The cartilage of the patellofemoral joint is preserved as well.  There is slight mucoid degeneration of her ACL this is likely related to her valgus malalignment and weight with time.  At this point one step would be to consider arthroscopic intervention for partial lateral meniscectomy and a better assessment of her cartilage.  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  Also weight loss is instrumental in helping preserve the cartilage in her knees.  I did talk to her in detail about arthroscopy of the right knee.  I explained what the surgery involves in detail including the risk and benefits of surgery.  Given the fact that she works 7 AM to 7 PM with her shifts, I would likely need to her out of work for up to 2 weeks to allow her to successfully rehabilitate her knee.  All  question concerns were answered and addressed.  I did give her our surgery schedulers card for when she like to consider surgery.

## 2018-08-05 ENCOUNTER — Encounter: Payer: Self-pay | Admitting: Family Medicine

## 2018-10-14 ENCOUNTER — Encounter: Payer: Self-pay | Admitting: Family Medicine

## 2018-10-17 ENCOUNTER — Other Ambulatory Visit: Payer: Self-pay | Admitting: Family Medicine

## 2018-10-17 DIAGNOSIS — F329 Major depressive disorder, single episode, unspecified: Secondary | ICD-10-CM

## 2018-10-17 DIAGNOSIS — F32A Depression, unspecified: Secondary | ICD-10-CM

## 2018-10-17 DIAGNOSIS — F419 Anxiety disorder, unspecified: Principal | ICD-10-CM

## 2018-10-17 MED ORDER — ESCITALOPRAM OXALATE 20 MG PO TABS: 20.0000 mg | ORAL_TABLET | Freq: Every day | ORAL | 3 refills | Status: DC

## 2018-11-03 MED FILL — ESCITALOPRAM 20 MG TABLET: 20 | 30 days supply | Qty: 30 | Fill #0

## 2018-12-12 MED FILL — ESCITALOPRAM 20 MG TABLET: 20 | 30 days supply | Qty: 30 | Fill #1 | Status: TO

## 2019-01-28 MED FILL — ESCITALOPRAM 20 MG TABLET: 20 | 30 days supply | Qty: 30 | Fill #0

## 2019-04-09 MED FILL — ESCITALOPRAM 20 MG TABLET: 20 | 30 days supply | Qty: 30 | Fill #0

## 2019-05-21 ENCOUNTER — Other Ambulatory Visit: Payer: Self-pay | Admitting: Family Medicine

## 2019-05-21 DIAGNOSIS — F419 Anxiety disorder, unspecified: Secondary | ICD-10-CM

## 2019-05-21 DIAGNOSIS — F329 Major depressive disorder, single episode, unspecified: Secondary | ICD-10-CM

## 2019-05-21 DIAGNOSIS — F32A Depression, unspecified: Secondary | ICD-10-CM

## 2019-05-21 MED FILL — ESCITALOPRAM 20 MG TABLET: 20 | 30 days supply | Qty: 30 | Fill #0

## 2019-05-21 NOTE — Telephone Encounter (Signed)
Dr. Volanda Napoleon Please advise

## 2019-07-07 ENCOUNTER — Other Ambulatory Visit: Payer: Self-pay | Admitting: Family Medicine

## 2019-07-07 DIAGNOSIS — F32A Depression, unspecified: Secondary | ICD-10-CM

## 2019-07-07 DIAGNOSIS — F419 Anxiety disorder, unspecified: Secondary | ICD-10-CM

## 2019-07-07 DIAGNOSIS — F329 Major depressive disorder, single episode, unspecified: Secondary | ICD-10-CM

## 2019-08-06 ENCOUNTER — Telehealth: Payer: Self-pay | Admitting: Family Medicine

## 2019-08-06 ENCOUNTER — Encounter: Payer: Self-pay | Admitting: Family Medicine

## 2019-08-06 ENCOUNTER — Other Ambulatory Visit: Payer: Self-pay | Admitting: Family Medicine

## 2019-08-06 DIAGNOSIS — F419 Anxiety disorder, unspecified: Secondary | ICD-10-CM

## 2019-08-06 DIAGNOSIS — F32A Depression, unspecified: Secondary | ICD-10-CM

## 2019-08-06 DIAGNOSIS — F329 Major depressive disorder, single episode, unspecified: Secondary | ICD-10-CM

## 2019-08-06 NOTE — Telephone Encounter (Signed)
Pt had sent in a msg to get scheduled for her Annual Physical with Dr. Volanda Napoleon.  We were not able to get it scheduled until 09/10/2019 at 10:00.  While on the phone pt was wanting to get her medications refilled LEXAPRO 20 MG and diclofenac sodium (VOLTAREN) 1% gel (cream for her knee)   Pham:  [facility] [address].

## 2019-08-07 ENCOUNTER — Other Ambulatory Visit: Payer: Self-pay | Admitting: Family Medicine

## 2019-08-07 MED ORDER — DICLOFENAC SODIUM 1 % TD GEL: 2.0000 g | Freq: Four times a day (QID) | TRANSDERMAL | 0 refills | Status: DC

## 2019-08-07 MED ORDER — ESCITALOPRAM OXALATE 20 MG PO TABS: 20.0000 mg | ORAL_TABLET | Freq: Every day | ORAL | 0 refills | Status: DC

## 2019-08-07 MED FILL — DICLOFENAC SODIUM 1 % GEL: 1 | 12 days supply | Qty: 100 | Fill #0

## 2019-08-07 MED FILL — ESCITALOPRAM 20 MG TABLET: 20 | 30 days supply | Qty: 30 | Fill #0

## 2019-08-07 NOTE — Telephone Encounter (Signed)
Meds refilled.  Voltaren gel is now available OTC.

## 2019-08-07 NOTE — Telephone Encounter (Signed)
Dr. Banks please advise  

## 2019-08-11 ENCOUNTER — Other Ambulatory Visit: Payer: Self-pay

## 2019-08-11 MED ORDER — DICLOFENAC SODIUM 1 % TD GEL: 2.0000 g | Freq: Four times a day (QID) | TRANSDERMAL | 0 refills | Status: DC

## 2019-09-10 ENCOUNTER — Encounter: Payer: 59 | Admitting: Family Medicine

## 2019-09-21 ENCOUNTER — Other Ambulatory Visit: Payer: Self-pay | Admitting: Family Medicine

## 2019-09-21 DIAGNOSIS — F329 Major depressive disorder, single episode, unspecified: Secondary | ICD-10-CM

## 2019-09-21 DIAGNOSIS — F32A Depression, unspecified: Secondary | ICD-10-CM

## 2019-09-21 NOTE — Telephone Encounter (Signed)
Pt has an office visit scheduled for 10/15/2019 and last refill was on 08/07/2019. Please advise

## 2019-09-30 MED FILL — DICLOFENAC SODIUM 1 % GEL: 1 | 12 days supply | Qty: 100 | Fill #0

## 2019-10-15 ENCOUNTER — Ambulatory Visit (INDEPENDENT_AMBULATORY_CARE_PROVIDER_SITE_OTHER): Payer: Medicaid Other | Admitting: Family Medicine

## 2019-10-15 ENCOUNTER — Encounter: Payer: Self-pay | Admitting: Family Medicine

## 2019-10-15 VITALS — BP 120/82 | HR 88 | Temp 97.8°F | Wt 236.0 lb

## 2019-10-15 DIAGNOSIS — E6609 Other obesity due to excess calories: Secondary | ICD-10-CM | POA: Diagnosis not present

## 2019-10-15 DIAGNOSIS — F329 Major depressive disorder, single episode, unspecified: Secondary | ICD-10-CM

## 2019-10-15 DIAGNOSIS — Z6834 Body mass index (BMI) 34.0-34.9, adult: Secondary | ICD-10-CM | POA: Diagnosis not present

## 2019-10-15 DIAGNOSIS — Z0000 Encounter for general adult medical examination without abnormal findings: Secondary | ICD-10-CM

## 2019-10-15 DIAGNOSIS — F419 Anxiety disorder, unspecified: Secondary | ICD-10-CM

## 2019-10-15 DIAGNOSIS — T148XXA Other injury of unspecified body region, initial encounter: Secondary | ICD-10-CM | POA: Diagnosis not present

## 2019-10-15 DIAGNOSIS — E049 Nontoxic goiter, unspecified: Secondary | ICD-10-CM | POA: Diagnosis not present

## 2019-10-15 DIAGNOSIS — F32A Depression, unspecified: Secondary | ICD-10-CM

## 2019-10-15 MED ORDER — HYDROXYZINE HCL 50 MG PO TABS: 50.0000 mg | ORAL_TABLET | Freq: Three times a day (TID) | ORAL | 1 refills | Status: DC | PRN

## 2019-10-15 MED FILL — hydrOXYzine HCL 50 MG TABS: 50 | 30 days supply | Qty: 90 | Fill #0

## 2019-10-15 NOTE — Progress Notes (Signed)
Subjective:     Rebecca Watson is a 38 y.o. female and is here for a comprehensive physical exam. The patient reports problems - increased stress and anxiety.  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  Pt notes increased stress at work as the toddler is full of energy and the guardians are not hands on.  Pt taking lexapro 20 mg but notes increased anxiety.  Pt notes irritability.  May smoke to relieve stress at the end of the day.  Also has two 39 yo boys at home.  Trying to find ways to relieve stress.  Social History   Socioeconomic History  . Marital status: Single    Spouse name: Not on file  . Number of children: Not on file  . Years of education: Not on file  . Highest education level: Not on file  Occupational History  . Not on file  Tobacco Use  . Smoking status: Current Every Day Smoker    Types: Cigarettes  . Smokeless tobacco: Never Used  Substance and Sexual Activity  . Alcohol use: No  . Drug use: Yes    Types: Marijuana  . Sexual activity: Yes    Birth control/protection: None, Surgical  Other Topics Concern  . Not on file  Social History Narrative  . Not on file   Social Determinants of Health   Financial Resource Strain:   . Difficulty of Paying Living Expenses: Not on file  Food Insecurity:   . Worried About Charity fundraiser in the Last Year: Not on file  . Ran Out of Food in the Last Year: Not on file  Transportation Needs:   . Lack of Transportation (Medical): Not on file  . Lack of Transportation (Non-Medical): Not on file  Physical Activity:   . Days of Exercise per Week: Not on file  . Minutes of Exercise per Session: Not on file  Stress:   . Feeling of Stress : Not on file  Social Connections:   . Frequency of Communication with Friends and Family: Not on file  . Frequency of Social Gatherings with Friends and Family: Not on file  . Attends Religious Services: Not on file  . Active Member of Clubs or Organizations: Not on file   . Attends Archivist Meetings: Not on file  . Marital Status: Not on file  Intimate Partner Violence:   . Fear of Current or Ex-Partner: Not on file  . Emotionally Abused: Not on file  . Physically Abused: Not on file  . Sexually Abused: Not on file   Health Maintenance  Topic Date Due  . PAP SMEAR-Modifier  06/22/2019  . TETANUS/TDAP  06/21/2026  . INFLUENZA VACCINE  Completed  . HIV Screening  Completed    The following portions of the patient's history were reviewed and updated as appropriate: allergies, current medications, past family history, past medical history, past social history, past surgical history and problem list.  Review of Systems Pertinent items noted in HPI and remainder of comprehensive ROS otherwise negative.   Objective:    BP 120/82 (BP Location: Left Arm, Patient Position: Sitting, Cuff Size: Large)   Pulse 88   Temp 97.8 F (36.6 C) (Temporal)   Wt 236 lb (107 kg)   SpO2 98%   BMI 38.09 kg/m  General appearance: alert, cooperative and no distress Head: Normocephalic, without obvious abnormality, atraumatic Eyes: conjunctivae/corneas clear. PERRL, EOM's intact. Fundi benign. Ears: normal TM's and external ear canals  both ears Nose: Nares normal. Septum midline. Mucosa normal. No drainage or sinus tenderness. Throat: lips, mucosa, and tongue normal; teeth and gums normal Neck: no adenopathy, no carotid bruit, no JVD, supple, symmetrical, trachea midline and thyroid enlarged, no tenderness/mass/nodules Lungs: clear to auscultation bilaterally Heart: regular rate and rhythm, S1, S2 normal, no murmur, click, rub or gallop Abdomen: soft, non-tender; bowel sounds normal; no masses,  no organomegaly Extremities: extremities normal, atraumatic, no cyanosis or edema Pulses: 2+ and symmetric Skin: Skin color, texture, turgor normal. No rashes or lesions Lymph nodes: Cervical, supraclavicular, and axillary nodes normal. Neurologic: Alert and  oriented X 3, normal strength and tone. Normal symmetric reflexes. Normal coordination and gait    Assessment:    Healthy female exam with increased anxiety and goiter.     Plan:     Anticipatory guidance given including wearing seatbelts, smoke detectors in the home, increasing physical activity, increasing p.o. intake of water and vegetables. -will obtain labs -pap done 06/21/2016 -mammogram due at age 48 -given handout -next CPE in 1 yr See After Visit Summary for Counseling Recommendations    Anxiety and depression  -continue lexapro 20 mg -pt to schedule counseling. -pt to find healthy ways to relive stress. - Plan: TSH, T4, Free, hydrOXYzine (ATARAX/VISTARIL) 50 MG tablet  Goiter  -consider thyroid u/s - Plan: TSH, T4, Free  Class 1 obesity due to excess calories without serious comorbidity with body mass index (BMI) of 34.0 to 34.9 in adult  -discussed increasing physical activity and making lifestyle changes - Plan: Hemoglobin A1c, Lipid Panel  F/u in 1 month  Grier Mitts, MD  This note is not being shared with the patient for the following reason: To respect privacy (The patient or proxy has requested that the information not be shared).

## 2019-10-16 ENCOUNTER — Encounter: Payer: Self-pay | Admitting: Family Medicine

## 2019-10-16 LAB — BASIC METABOLIC PANEL
BUN: 11 mg/dL (ref 6–23)
CO2: 25 mEq/L (ref 19–32)
Calcium: 9.2 mg/dL (ref 8.4–10.5)
Chloride: 105 mEq/L (ref 96–112)
Creatinine, Ser: 0.75 mg/dL (ref 0.40–1.20)
GFR: 104.02 mL/min (ref >60.00)
Glucose, Bld: 77 mg/dL (ref 70–99)
Potassium: 3.9 mEq/L (ref 3.5–5.1)
Sodium: 138 mEq/L (ref 135–145)

## 2019-10-16 LAB — CBC WITH DIFFERENTIAL/PLATELET
Basophils Absolute: 0.1 10*3/uL (ref 0.0–0.1)
Basophils Relative: 1.4 % (ref 0.0–3.0)
Eosinophils Absolute: 0.1 10*3/uL (ref 0.0–0.7)
Eosinophils Relative: 1.2 % (ref 0.0–5.0)
HCT: 36.2 % (ref 36.0–46.0)
Hemoglobin: 11.8 g/dL — ABNORMAL LOW (ref 12.0–15.0)
Lymphocytes Relative: 34.9 % (ref 12.0–46.0)
Lymphs Abs: 2.1 10*3/uL (ref 0.7–4.0)
MCHC: 32.5 g/dL (ref 30.0–36.0)
MCV: 77.4 fl — ABNORMAL LOW (ref 78.0–100.0)
Monocytes Absolute: 0.4 10*3/uL (ref 0.1–1.0)
Monocytes Relative: 6.6 % (ref 3.0–12.0)
Neutro Abs: 3.3 10*3/uL (ref 1.4–7.7)
Neutrophils Relative %: 55.9 % (ref 43.0–77.0)
Platelets: 285 10*3/uL (ref 150.0–400.0)
RBC: 4.67 Mil/uL (ref 3.87–5.11)
RDW: 16.7 % — ABNORMAL HIGH (ref 11.5–15.5)
WBC: 5.9 10*3/uL (ref 4.0–10.5)

## 2019-10-16 LAB — TSH: TSH: 1.27 u[IU]/mL (ref 0.35–4.50)

## 2019-10-16 LAB — LIPID PANEL
Cholesterol: 193 mg/dL (ref 0–200)
HDL: 70.8 mg/dL (ref >39.00)
LDL Cholesterol: 112 mg/dL — ABNORMAL HIGH (ref 0–99)
NonHDL: 122.54
Total CHOL/HDL Ratio: 3
Triglycerides: 55 mg/dL (ref 0.0–149.0)
VLDL: 11 mg/dL (ref 0.0–40.0)

## 2019-10-16 LAB — HEMOGLOBIN A1C: Hgb A1c MFr Bld: 5.1 % (ref 4.6–6.5)

## 2019-10-16 LAB — T4, FREE: Free T4: 0.9 ng/dL (ref 0.60–1.60)

## 2019-10-16 MED ORDER — CYCLOBENZAPRINE HCL 5 MG PO TABS: 5.0000 mg | ORAL_TABLET | Freq: Three times a day (TID) | ORAL | 1 refills | Status: DC | PRN

## 2019-10-16 MED FILL — CYCLOBENZAPRINE 5 MG TABLET: 5 | 10 days supply | Qty: 30 | Fill #0

## 2019-10-28 ENCOUNTER — Other Ambulatory Visit: Payer: Self-pay | Admitting: Family Medicine

## 2019-10-28 DIAGNOSIS — F329 Major depressive disorder, single episode, unspecified: Secondary | ICD-10-CM

## 2019-10-28 DIAGNOSIS — F419 Anxiety disorder, unspecified: Secondary | ICD-10-CM

## 2019-10-28 DIAGNOSIS — F32A Depression, unspecified: Secondary | ICD-10-CM

## 2019-10-28 MED FILL — DICLOFENAC SODIUM 1 % GEL: 1 | 13 days supply | Qty: 100 | Fill #0

## 2019-10-28 MED FILL — CYCLOBENZAPRINE 5 MG TABLET: 5 | 10 days supply | Qty: 30 | Fill #0

## 2019-10-28 MED FILL — hydrOXYzine HCL 50 MG TABS: 50 | 30 days supply | Qty: 90 | Fill #0

## 2019-10-28 MED FILL — ESCITALOPRAM 20 MG TABLET: 20 | 30 days supply | Qty: 30 | Fill #0

## 2019-11-24 ENCOUNTER — Encounter: Payer: Self-pay | Admitting: Family Medicine

## 2019-11-26 ENCOUNTER — Ambulatory Visit: Payer: Medicaid Other | Admitting: Family Medicine

## 2019-12-04 DIAGNOSIS — Z23 Encounter for immunization: Secondary | ICD-10-CM | POA: Diagnosis not present

## 2019-12-25 ENCOUNTER — Telehealth: Payer: Self-pay | Admitting: Family Medicine

## 2019-12-25 NOTE — Telephone Encounter (Signed)
Medication Refill: Lexapro Pharmacy: [facility]  Phone:

## 2019-12-29 NOTE — Telephone Encounter (Signed)
Left a message for pt to call office and schedule virtual visit for med refill

## 2019-12-30 NOTE — Telephone Encounter (Signed)
Pt LOV was 12/17/202 and last refill was done on 10/28/2019 for 30 tablets, please advise

## 2020-01-01 ENCOUNTER — Other Ambulatory Visit: Payer: Self-pay | Admitting: Family Medicine

## 2020-01-01 DIAGNOSIS — F329 Major depressive disorder, single episode, unspecified: Secondary | ICD-10-CM

## 2020-01-01 DIAGNOSIS — F32A Depression, unspecified: Secondary | ICD-10-CM

## 2020-01-01 DIAGNOSIS — F419 Anxiety disorder, unspecified: Secondary | ICD-10-CM

## 2020-01-01 MED ORDER — ESCITALOPRAM OXALATE 20 MG PO TABS: 20.0000 mg | ORAL_TABLET | Freq: Every day | ORAL | 3 refills | Status: DC

## 2020-01-01 MED FILL — ESCITALOPRAM 20 MG TABLET: 20 | 30 days supply | Qty: 30 | Fill #0

## 2020-01-01 NOTE — Telephone Encounter (Signed)
Refill sent to pharmacy.   

## 2020-01-13 MED FILL — hydrOXYzine HCL 50 MG TABS: 50 | 30 days supply | Qty: 90 | Fill #1

## 2020-01-13 MED FILL — ESCITALOPRAM 20 MG TABLET: 20 | 30 days supply | Qty: 30 | Fill #0

## 2020-03-16 ENCOUNTER — Other Ambulatory Visit: Payer: Self-pay

## 2020-03-17 ENCOUNTER — Other Ambulatory Visit: Payer: Self-pay | Admitting: Family Medicine

## 2020-03-17 ENCOUNTER — Encounter: Payer: Self-pay | Admitting: Family Medicine

## 2020-03-17 ENCOUNTER — Ambulatory Visit (INDEPENDENT_AMBULATORY_CARE_PROVIDER_SITE_OTHER): Payer: Medicaid Other | Admitting: Family Medicine

## 2020-03-17 VITALS — BP 102/78 | HR 76 | Temp 97.8°F | Wt 221.0 lb

## 2020-03-17 DIAGNOSIS — R55 Syncope and collapse: Secondary | ICD-10-CM

## 2020-03-17 DIAGNOSIS — F329 Major depressive disorder, single episode, unspecified: Secondary | ICD-10-CM | POA: Diagnosis not present

## 2020-03-17 DIAGNOSIS — N809 Endometriosis, unspecified: Secondary | ICD-10-CM | POA: Diagnosis not present

## 2020-03-17 DIAGNOSIS — K5900 Constipation, unspecified: Secondary | ICD-10-CM | POA: Diagnosis not present

## 2020-03-17 DIAGNOSIS — R1084 Generalized abdominal pain: Secondary | ICD-10-CM | POA: Diagnosis not present

## 2020-03-17 DIAGNOSIS — Z113 Encounter for screening for infections with a predominantly sexual mode of transmission: Secondary | ICD-10-CM

## 2020-03-17 DIAGNOSIS — F419 Anxiety disorder, unspecified: Secondary | ICD-10-CM | POA: Diagnosis not present

## 2020-03-17 DIAGNOSIS — F32A Depression, unspecified: Secondary | ICD-10-CM

## 2020-03-17 LAB — POCT URINALYSIS DIPSTICK
Bilirubin, UA: NEGATIVE
Blood, UA: NEGATIVE
Glucose, UA: NEGATIVE
Ketones, UA: NEGATIVE
Leukocytes, UA: NEGATIVE
Nitrite, UA: NEGATIVE
Odor: NEGATIVE
Protein, UA: POSITIVE — AB
Spec Grav, UA: 1.025 (ref 1.010–1.025)
Urobilinogen, UA: 0.2 E.U./dL
pH, UA: 6.5 (ref 5.0–8.0)

## 2020-03-17 LAB — CBC
HCT: 34.5 % — ABNORMAL LOW (ref 36.0–46.0)
Hemoglobin: 11 g/dL — ABNORMAL LOW (ref 12.0–15.0)
MCHC: 32 g/dL (ref 30.0–36.0)
MCV: 74.9 fl — ABNORMAL LOW (ref 78.0–100.0)
Platelets: 293 10*3/uL (ref 150.0–400.0)
RBC: 4.6 Mil/uL (ref 3.87–5.11)
RDW: 17.8 % — ABNORMAL HIGH (ref 11.5–15.5)
WBC: 6.2 10*3/uL (ref 4.0–10.5)

## 2020-03-17 LAB — COMPREHENSIVE METABOLIC PANEL
ALT: 7 U/L (ref 0–35)
AST: 12 U/L (ref 0–37)
Albumin: 4.2 g/dL (ref 3.5–5.2)
Alkaline Phosphatase: 67 U/L (ref 39–117)
BUN: 10 mg/dL (ref 6–23)
CO2: 25 mEq/L (ref 19–32)
Calcium: 9.2 mg/dL (ref 8.4–10.5)
Chloride: 105 mEq/L (ref 96–112)
Creatinine, Ser: 0.69 mg/dL (ref 0.40–1.20)
GFR: 114.28 mL/min (ref >60.00)
Glucose, Bld: 78 mg/dL (ref 70–99)
Potassium: 4 mEq/L (ref 3.5–5.1)
Sodium: 138 mEq/L (ref 135–145)
Total Bilirubin: 0.4 mg/dL (ref 0.2–1.2)
Total Protein: 6.9 g/dL (ref 6.0–8.3)

## 2020-03-17 LAB — LIPASE: Lipase: 3 U/L — ABNORMAL LOW (ref 11.0–59.0)

## 2020-03-17 LAB — TSH: TSH: 0.49 u[IU]/mL (ref 0.35–4.50)

## 2020-03-17 LAB — T4, FREE: Free T4: 0.94 ng/dL (ref 0.60–1.60)

## 2020-03-17 MED ORDER — PAROXETINE HCL 20 MG PO TABS: 20.0000 mg | ORAL_TABLET | Freq: Every day | ORAL | 2 refills | Status: DC

## 2020-03-17 MED FILL — PARoxetine HCL 20 MG TABS: 20 | 30 days supply | Qty: 30 | Fill #0

## 2020-03-17 NOTE — Progress Notes (Signed)
Subjective:    Patient ID: Rebecca Watson, female    DOB: [birth year], 39 y.o.   MRN: [id number]  No chief complaint on file.   HPI Patient was seen today for f/u and several concerns.  Pt with Increased stress and anxiety.  Stopped lexapro 20 mg 2.5 wks ago as felt more aggressive.  Now crying intermittently and having difficulty concentrating. Other people have noticed.  In the past Zoloft caused emesis.  Endorses regular MJ use.  Pt with LLQ, epigastric pain, hemorrhoids, and constipation x 1.5 months.  Tried MOM and miralax.  Pt was told she had endometriosis but has not f/u with OB/Gyn.  Pt worried her symptoms may be caused by an STI.  Pt also mentions a syncopal episode at home, her son had to help her up.  Endorses may have a syncopal episode once a yr.  Denies hypoglycemia, dizziness, SOB at the time.  Past Medical History:  Diagnosis Date  . Allergy   . Diverticulitis   . Diverticulitis   . Diverticulosis   . GERD (gastroesophageal reflux disease)   . Obesity   . Thyroid disease     Allergies  Allergen Reactions  . Doxycycline Nausea And Vomiting    ROS General: Denies fever, chills, night sweats, changes in weight, changes in appetite  +syncopal episode HEENT: Denies headaches, ear pain, changes in vision, rhinorrhea, sore throat CV: Denies CP, palpitations, SOB, orthopnea Pulm: Denies SOB, cough, wheezing GI: Denies nausea, vomiting, diarrhea   +abdominal pain, constipation  GU: Denies dysuria, hematuria, frequency, vaginal discharge Msk: Denies muscle cramps, joint pains Neuro: Denies weakness, numbness, tingling Skin: Denies rashes, bruising Psych: Denies hallucinations  +anxiety, depression    Objective:    Blood pressure 102/78, pulse 76, temperature 97.8 F (36.6 C), temperature source Temporal, weight 221 lb (100.2 kg), SpO2 96 %.  Gen. Pleasant, well-nourished, in no distress, normal affect   HEENT: [place]/AT, face symmetric, no scleral icterus, PERRLA,  EOMI, nares patent without drainage. Neck: No JVD, no thyromegaly, no carotid bruits Lungs: no accessory muscle use, CTAB, no wheezes or rales Cardiovascular: RRR, no m/r/g, no peripheral edema.  Abdomen: BS present, soft, NT/ND, no hepatosplenomegaly. Musculoskeletal: No deformities, no cyanosis or clubbing, normal tone Neuro:  A&Ox3, CN II-XII intact, normal gait Skin:  Warm, no lesions/ rash   Wt Readings from Last 3 Encounters:  03/17/20 221 lb (100.2 kg)  10/15/19 236 lb (107 kg)  08/04/18 214 lb (97.1 kg)    Lab Results  Component Value Date   WBC 5.9 10/15/2019   HGB 11.8 (L) 10/15/2019   HCT 36.2 10/15/2019   PLT 285.0 10/15/2019   GLUCOSE 77 10/15/2019   CHOL 193 10/15/2019   TRIG 55.0 10/15/2019   HDL 70.80 10/15/2019   LDLCALC 112 (H) 10/15/2019   ALT 6 11/21/2017   AST 10 11/21/2017   NA 138 10/15/2019   K 3.9 10/15/2019   CL 105 10/15/2019   CREATININE 0.75 10/15/2019   BUN 11 10/15/2019   CO2 25 10/15/2019   TSH 1.27 10/15/2019   HGBA1C 5.1 10/15/2019    Assessment/Plan:  Syncope, unspecified syncope type -Discussed various causes including hypoglycemia, dehydration, cardiovascular etiology -EKG this visit sinus bradycardia.  No ST changes noted.  No previous studies for comparison. -Consider echo and cardiology referral for continued symptoms - Plan: EKG 12-Lead, Comprehensive metabolic panel, CBC (no diff), TSH, T4, Free  Routine screening for STI (sexually transmitted infection)  - Plan: HIV antibody (with reflex), RPR  Anxiety and depression  -PHQ-9 score 12 -GAD-7 score 13 -d/c Lexapro 20 mg as pt felt more aggressive.  In the past Zoloft 50 mg) -Discussed counseling -We will start Paxil 20 mg. -Follow-up in 4-6 weeks, sooner if needed - Plan: TSH, T4, Free, PARoxetine (PAXIL) 20 MG tablet  Generalized abdominal pain  - Plan: Comprehensive metabolic panel, Lipase, POCT urinalysis dipstick, Ambulatory referral to  Gastroenterology  Constipation, unspecified constipation type  -Discussed MiraLAX twice daily - Plan: Ambulatory referral to Gastroenterology  Endometriosis determined by laparoscopy  - Plan: Ambulatory referral to Obstetrics / Gynecology  F/u in 1 month  Grier Mitts, MD

## 2020-03-17 NOTE — Patient Instructions (Addendum)
You can take Miralax BID.  Abdominal Pain, Adult Pain in the abdomen (abdominal pain) can be caused by many things. Often, abdominal pain is not serious and it gets better with no treatment or by being treated at home. However, sometimes abdominal pain is serious. Your health care provider will ask questions about your medical history and do a physical exam to try to determine the cause of your abdominal pain. Follow these instructions at home:  Medicines  Take over-the-counter and prescription medicines only as told by your health care provider.  Do not take a laxative unless told by your health care provider. General instructions  Watch your condition for any changes.  Drink enough fluid to keep your urine pale yellow.  Keep all follow-up visits as told by your health care provider. This is important. Contact a health care provider if:  Your abdominal pain changes or gets worse.  You are not hungry or you lose weight without trying.  You are constipated or have diarrhea for more than 2-3 days.  You have pain when you urinate or have a bowel movement.  Your abdominal pain wakes you up at night.  Your pain gets worse with meals, after eating, or with certain foods.  You are vomiting and cannot keep anything down.  You have a fever.  You have blood in your urine. Get help right away if:  Your pain does not go away as soon as your health care provider told you to expect.  You cannot stop vomiting.  Your pain is only in areas of the abdomen, such as the right side or the left lower portion of the abdomen. Pain on the right side could be caused by appendicitis.  You have bloody or black stools, or stools that look like tar.  You have severe pain, cramping, or bloating in your abdomen.  You have signs of dehydration, such as: ? Dark urine, very little urine, or no urine. ? Cracked lips. ? Dry mouth. ? Sunken eyes. ? Sleepiness. ? Weakness.  You have trouble  breathing or chest pain. Summary  Often, abdominal pain is not serious and it gets better with no treatment or by being treated at home. However, sometimes abdominal pain is serious.  Watch your condition for any changes.  Take over-the-counter and prescription medicines only as told by your health care provider.  Contact a health care provider if your abdominal pain changes or gets worse.  Get help right away if you have severe pain, cramping, or bloating in your abdomen. This information is not intended to replace advice given to you by your health care provider. Make sure you discuss any questions you have with your health care provider. Document Revised: 02/23/2019 Document Reviewed: 02/23/2019 Elsevier Patient Education  [place].  Major Depressive Disorder, Adult Major depressive disorder (MDD) is a mental health condition. MDD often makes you feel sad, hopeless, or helpless. MDD can also cause symptoms in your body. MDD can affect your:  Work.  School.  Relationships.  Other normal activities. MDD can range from mild to very bad. It may occur once (single episode MDD). It can also occur many times (recurrent MDD). The main symptoms of MDD often include:  Feeling sad, depressed, or irritable most of the time.  Loss of interest. MDD symptoms also include:  Sleeping too much or too little.  Eating too much or too little.  A change in your weight.  Feeling tired (fatigue) or having low energy.  Feeling worthless.  Feeling guilty.  Trouble making decisions.  Trouble thinking clearly.  Thoughts of suicide or harming others.  Feeling weak.  Feeling agitated.  Keeping yourself from being around other people (isolation). Follow these instructions at home: Activity  Do these things as told by your doctor: ? Go back to your normal activities. ? Exercise regularly. ? Spend time outdoors. Alcohol  Talk with your doctor about how alcohol can affect  your antidepressant medicines.  Do not drink alcohol. Or, limit how much alcohol you drink. ? This means no more than 1 drink a day for nonpregnant women and 2 drinks a day for men. One drink equals one of these:  12 oz of beer.  5 oz of wine.  1 oz of hard liquor. General instructions  Take over-the-counter and prescription medicines only as told by your doctor.  Eat a healthy diet.  Get plenty of sleep.  Find activities that you enjoy. Make time to do them.  Think about joining a support group. Your doctor may be able to suggest a group for you.  Keep all follow-up visits as told by your doctor. This is important. Where to find more information:  [employer] on Mental Illness: ? www.nami.[place]: ? https://carter.com/  National Suicide Prevention Lifeline: ? (857) 574-7964. This is free, 24-hour help. Contact a doctor if:  Your symptoms get worse.  You have new symptoms. Get help right away if:  You self-harm.  You see, hear, taste, smell, or feel things that are not present (hallucinate). If you ever feel like you may hurt yourself or others, or have thoughts about taking your own life, get help right away. You can go to your nearest emergency department or call:  Your local emergency services (911 in the [place]  A suicide crisis helpline, such as the National Suicide Prevention Lifeline: ? 458-298-8188. This is open 24 hours a day. This information is not intended to replace advice given to you by your health care provider. Make sure you discuss any questions you have with your health care provider. Document Revised: 09/27/2017 Document Reviewed: 07/01/2016 Elsevier Patient Education  [place], Adult After being diagnosed with an anxiety disorder, you may be relieved to know why you have felt or behaved a certain way. You may also feel overwhelmed about the treatment ahead and what it  will mean for your life. With care and support, you can manage this condition and recover from it. How to manage lifestyle changes Managing stress and anxiety  Stress is your body's reaction to life changes and events, both good and bad. Most stress will last just a few hours, but stress can be ongoing and can lead to more than just stress. Although stress can play a major role in anxiety, it is not the same as anxiety. Stress is usually caused by something external, such as a deadline, test, or competition. Stress normally passes after the triggering event has ended.  Anxiety is caused by something internal, such as imagining a terrible outcome or worrying that something will go wrong that will devastate you. Anxiety often does not go away even after the triggering event is over, and it can become long-term (chronic) worry. It is important to understand the differences between stress and anxiety and to manage your stress effectively so that it does not lead to an anxious response. Talk with your health care provider or a counselor to learn more about reducing anxiety and stress.  He or she may suggest tension reduction techniques, such as:  Music therapy. This can include creating or listening to music that you enjoy and that inspires you.  Mindfulness-based meditation. This involves being aware of your normal breaths while not trying to control your breathing. It can be done while sitting or walking.  Centering prayer. This involves focusing on a word, phrase, or sacred image that means something to you and brings you peace.  Deep breathing. To do this, expand your stomach and inhale slowly through your nose. Hold your breath for 3-5 seconds. Then exhale slowly, letting your stomach muscles relax.  Self-talk. This involves identifying thought patterns that lead to anxiety reactions and changing those patterns.  Muscle relaxation. This involves tensing muscles and then relaxing them. Choose a  tension reduction technique that suits your lifestyle and personality. These techniques take time and practice. Set aside 5-15 minutes a day to do them. Therapists can offer counseling and training in these techniques. The training to help with anxiety may be covered by some insurance plans. Other things you can do to manage stress and anxiety include:  Keeping a stress/anxiety diary. This can help you learn what triggers your reaction and then learn ways to manage your response.  Thinking about how you react to certain situations. You may not be able to control everything, but you can control your response.  Making time for activities that help you relax and not feeling guilty about spending your time in this way.  Visual imagery and yoga can help you stay calm and relax.  Medicines Medicines can help ease symptoms. Medicines for anxiety include:  Anti-anxiety drugs.  Antidepressants. Medicines are often used as a primary treatment for anxiety disorder. Medicines will be prescribed by a health care provider. When used together, medicines, psychotherapy, and tension reduction techniques may be the most effective treatment. Relationships Relationships can play a big part in helping you recover. Try to spend more time connecting with trusted friends and family members. Consider going to couples counseling, taking family education classes, or going to family therapy. Therapy can help you and others better understand your condition. How to recognize changes in your anxiety Everyone responds differently to treatment for anxiety. Recovery from anxiety happens when symptoms decrease and stop interfering with your daily activities at home or work. This may mean that you will start to:  Have better concentration and focus. Worry will interfere less in your daily thinking.  Sleep better.  Be less irritable.  Have more energy.  Have improved memory. It is important to recognize when your  condition is getting worse. Contact your health care provider if your symptoms interfere with home or work and you feel like your condition is not improving. Follow these instructions at home: Activity  Exercise. Most adults should do the following: ? Exercise for at least 150 minutes each week. The exercise should increase your heart rate and make you sweat (moderate-intensity exercise). ? Strengthening exercises at least twice a week.  Get the right amount and quality of sleep. Most adults need 7-9 hours of sleep each night. Lifestyle   Eat a healthy diet that includes plenty of vegetables, fruits, whole grains, low-fat dairy products, and lean protein. Do not eat a lot of foods that are high in solid fats, added sugars, or salt.  Make choices that simplify your life.  Do not use any products that contain nicotine or tobacco, such as cigarettes, e-cigarettes, and chewing tobacco. If you need help quitting,  ask your health care provider.  Avoid caffeine, alcohol, and certain over-the-counter cold medicines. These may make you feel worse. Ask your pharmacist which medicines to avoid. General instructions  Take over-the-counter and prescription medicines only as told by your health care provider.  Keep all follow-up visits as told by your health care provider. This is important. Where to find support You can get help and support from these sources:  Self-help groups.  Online and [employer].  A trusted spiritual leader.  Couples counseling.  Family education classes.  Family therapy. Where to find more information You may find that joining a support group helps you deal with your anxiety. The following sources can help you locate counselors or support groups near you:  [place]: www.mentalhealthamerica.net  Anxiety and Depression Association of [place] (ADAA): https://www.clark.net/  National Alliance on Mental Illness (NAMI): www.nami.org Contact a health  care provider if you:  Have a hard time staying focused or finishing daily tasks.  Spend many hours a day feeling worried about everyday life.  Become exhausted by worry.  Start to have headaches, feel tense, or have nausea.  Urinate more than normal.  Have diarrhea. Get help right away if you have:  A racing heart and shortness of breath.  Thoughts of hurting yourself or others. If you ever feel like you may hurt yourself or others, or have thoughts about taking your own life, get help right away. You can go to your nearest emergency department or call:  Your local emergency services (911 in the [place]  A suicide crisis helpline, such as the [place] at 202-503-4300. This is open 24 hours a day. Summary  Taking steps to learn and use tension reduction techniques can help calm you and help prevent triggering an anxiety reaction.  When used together, medicines, psychotherapy, and tension reduction techniques may be the most effective treatment.  Family, friends, and partners can play a big part in helping you recover from an anxiety disorder. This information is not intended to replace advice given to you by your health care provider. Make sure you discuss any questions you have with your health care provider. Document Revised: 03/17/2019 Document Reviewed: 03/17/2019 Elsevier Patient Education  [place].  Syncope Syncope is when you pass out (faint) for a short time. It is caused by a sudden decrease in blood flow to the brain. Signs that you may be about to pass out include:  Feeling dizzy or light-headed.  Feeling sick to your stomach (nauseous).  Seeing all white or all black.  Having cold, clammy skin. If you pass out, get help right away. Call your local emergency services (911 in the [place] Do not drive yourself to the hospital. Follow these instructions at home: Watch for any changes in your symptoms. Take these actions to  stay safe and help with your symptoms: Lifestyle  Do not drive, use machinery, or play sports until your doctor says it is okay.  Do not drink alcohol.  Do not use any products that contain nicotine or tobacco, such as cigarettes and e-cigarettes. If you need help quitting, ask your doctor.  Drink enough fluid to keep your pee (urine) pale yellow. General instructions  Take over-the-counter and prescription medicines only as told by your doctor.  If you are taking blood pressure or heart medicine, sit up and stand up slowly. Spend a few minutes getting ready to sit and then stand. This can help you feel less dizzy.  Have someone  stay with you until you feel stable.  If you start to feel like you might pass out, lie down right away and raise (elevate) your feet above the level of your heart. Breathe deeply and steadily. Wait until all of the symptoms are gone.  Keep all follow-up visits as told by your doctor. This is important. Get help right away if:  You have a very bad headache.  You pass out once or more than once.  You have pain in your chest, belly, or back.  You have a very fast or uneven heartbeat (palpitations).  It hurts to breathe.  You are bleeding from your mouth or your bottom (rectum).  You have black or tarry poop (stool).  You have jerky movements that you cannot control (seizure).  You are confused.  You have trouble walking.  You are very weak.  You have vision problems. These symptoms may be an emergency. Do not wait to see if the symptoms will go away. Get medical help right away. Call your local emergency services (911 in the [place] Do not drive yourself to the hospital. Summary  Syncope is when you pass out (faint) for a short time. It is caused by a sudden decrease in blood flow to the brain.  Signs that you may be about to faint include feeling dizzy, light-headed, or sick to your stomach, seeing all white or all black, or having cold,  clammy skin.  If you start to feel like you might pass out, lie down right away and raise (elevate) your feet above the level of your heart. Breathe deeply and steadily. Wait until all of the symptoms are gone. This information is not intended to replace advice given to you by your health care provider. Make sure you discuss any questions you have with your health care provider. Document Revised: 11/27/2017 Document Reviewed: 11/27/2017 Elsevier Patient Education  2020 [employer].

## 2020-03-18 LAB — RPR: RPR Ser Ql: NONREACTIVE

## 2020-03-18 LAB — HIV ANTIBODY (ROUTINE TESTING W REFLEX): HIV 1&2 Ab, 4th Generation: NONREACTIVE

## 2020-03-18 MED ORDER — FERROUS SULFATE 325 (65 FE) MG PO TBEC: 325.0000 mg | DELAYED_RELEASE_TABLET | Freq: Every day | ORAL | 0 refills | Status: DC

## 2020-03-24 ENCOUNTER — Encounter: Payer: Self-pay | Admitting: Family Medicine

## 2020-03-29 ENCOUNTER — Other Ambulatory Visit: Payer: Self-pay

## 2020-03-30 ENCOUNTER — Encounter: Payer: Self-pay | Admitting: Obstetrics and Gynecology

## 2020-03-30 ENCOUNTER — Ambulatory Visit: Payer: Medicaid Other | Admitting: Obstetrics and Gynecology

## 2020-04-05 ENCOUNTER — Encounter: Payer: Self-pay | Admitting: Gastroenterology

## 2020-04-18 ENCOUNTER — Ambulatory Visit: Payer: Medicaid Other | Admitting: Obstetrics and Gynecology

## 2020-05-24 ENCOUNTER — Encounter: Payer: Self-pay | Admitting: Gastroenterology

## 2020-05-24 ENCOUNTER — Ambulatory Visit: Payer: Medicaid Other | Admitting: Gastroenterology

## 2020-05-24 VITALS — BP 110/74 | HR 74 | Ht 66.0 in | Wt 211.5 lb

## 2020-05-24 DIAGNOSIS — R109 Unspecified abdominal pain: Secondary | ICD-10-CM

## 2020-05-24 DIAGNOSIS — K5900 Constipation, unspecified: Secondary | ICD-10-CM

## 2020-05-24 MED ORDER — DICYCLOMINE HCL 20 MG PO TABS: 20.0000 mg | ORAL_TABLET | Freq: Four times a day (QID) | ORAL | 0 refills | Status: DC

## 2020-05-24 NOTE — Patient Instructions (Addendum)
Given her ongoing abdominal pain, constipation, and history of diverticulitis and endometriosis I recommend: - Follow a high fiber diet. Use Citrucel or Benefiber every daily. - Trial of dicyclomine taken up to 4 times daily for abdominal pain and cramping. - There is no need to avoid seeds, nuts, corn, and berries. - Non-steroidal antiinflammatory medications (such as ibuprofen, naproxen, etc) may increase your risk for a recurrent of diverticulitis. Please avoid these medications whenever possible.  - EGD and colonoscopy. We will call you when dates come open and set you up a pre-visit to get your instructions. - Follow-up with a gynecologist, we will work on a referral   I appreciate the opportunity to care for you. [place], MD, MPH

## 2020-05-24 NOTE — Progress Notes (Signed)
Referring Provider: Billie Ruddy, MD Primary Care Physician:  Billie Ruddy, MD  Reason for Consultation:  Abdominal pain   IMPRESSION:  LLQ/groin pain and epigastric abdominal pain    - normal liver enzymes and lipase Constipation with possible pelvic floor dysfunction Endometriosis Diverticulitis by CT 2011 and 2017 - mid-descending colon and in the proximal sigmoid colon.  LLQ/groin pain and epigastric abdominal pain with constipation: Although constipation is not clearly associated with abdominal pain, differential includes chronic constipation, constipation IBS versus, endometriosis, and less likely IBD, celiac, or other mimickers of IBS. She also has a history of diverticulitis and the differential would even include diverticular stricture.   GI symptoms are sorsened by anxiety.   PLAN: Add a daily dose of Citrucel or Benefiber given the history of diverticulosis/diverticulitis Avoid NSAIDs Dicyclomine 20 mg QID PRN pain/cramping EGD and colonoscopy with a 2 day prep Low threshold to repeat CT scan with contrast with escalating symptoms prior to endoscopy Referral to GYN given her symptoms, history of endometriosis, and family history Trial of Linzess 145 mcg daily after endoscopic evaluation  Please see the "Patient Instructions" section for addition details about the plan.  HPI: Rebecca Watson is a 39 y.o. female seen in consultation at the request of Dr. Volanda Napoleon for abdominal pain. She arrived 30 minutes late for this appointment. The history is obtained through the patient and review of her electronic health record. She has anxiety and endometriosis but has not followed up with GYN. She does not have a gynecologist. 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 She completed the Covid vaccine.  Symptoms started in 2007 but she started seeking care in 2009 when she was hospitalized for both diverticulitis and endometriosis. Symptoms have occurred  intermittently since that time.  Presents today with 1.5 months of escalating LLQ/groin pain and epigastric pain, hemorrhoids, and constipation x 1.5 months. Lower abdominal pain and upper abdominal cramping occur concurrently. Feels like contractions. If she applies abdominal wall pressure it may resolve in 5-10 minutes.  One bowel movement a weeks with associated straining. Some blood and mucous. She is unsure if the constipation or abdominal pain is relieved by defecation. Constipation worsens the hemorrhoids with bleeding, itching, and pain.   Finds that certain foods trigger her symptoms: rice, beans, vegetables, greasy and spicy foods Tried MOM and Miralax, Miralax for a few days without change in symptoms. Seena causes more cramping.  CMP, TSH, and lipase were normal 03/17/18.   No recent abdominal imaging. CT in 2011 showed diffuse colon diverticulosis with acute diverticulitis in the sigmoid colon.  Last CT in 2017 showed acute diverticulitis in mid-descending colon and in the proximal sigmoid colon. No prior endoscopic evaluation.  She is concerned about cancer given her family.  Mother with ovarian cancer at age 60. Sister with ovarian cancer at an unknown age. Sister with polyps - but she doesn't know what kind. No known family history of colon cancer or polyps. No family history of uterine/endometrial cancer, pancreatic cancer or gastric/stomach cancer.   Past Medical History:  Diagnosis Date  . Allergy   . Diverticulitis   . Diverticulosis   . GERD (gastroesophageal reflux disease)   . Obesity   . Thyroid disease     Past Surgical History:  Procedure Laterality Date  . CESAREAN SECTION    . CESAREAN SECTION    . TUBAL LIGATION    . TUBAL LIGATION      Current Outpatient  Medications  Medication Sig Dispense Refill  . acetaminophen (TYLENOL) 500 MG tablet Take 1,000 mg by mouth every 6 (six) hours as needed for moderate pain or headache.    . cyclobenzaprine  (FLEXERIL) 5 MG tablet Take 1 tablet (5 mg total) by mouth 3 (three) times daily as needed for muscle spasms. 30 tablet 1  . diclofenac sodium (VOLTAREN) 1 % GEL Apply 2 g topically 4 (four) times daily. 100 g 0  . ferrous sulfate 325 (65 FE) MG EC tablet Take 1 tablet (325 mg total) by mouth daily with breakfast. 90 tablet 0  . hydrOXYzine (ATARAX/VISTARIL) 50 MG tablet Take 1 tablet (50 mg total) by mouth 3 (three) times daily as needed for anxiety. 90 tablet 1  . PARoxetine (PAXIL) 20 MG tablet Take 1 tablet (20 mg total) by mouth daily. 30 tablet 2   No current facility-administered medications for this visit.    Allergies as of 05/24/2020 - Review Complete 03/24/2020  Allergen Reaction Noted  . Doxycycline Nausea And Vomiting 07/12/2018    No family history on file.  Social History   Socioeconomic History  . Marital status: Single    Spouse name: Not on file  . Number of children: Not on file  . Years of education: Not on file  . Highest education level: Not on file  Occupational History  . Not on file  Tobacco Use  . Smoking status: Current Every Day Smoker    Types: Cigarettes  . Smokeless tobacco: Never Used  Vaping Use  . Vaping Use: Never used  Substance and Sexual Activity  . Alcohol use: No  . Drug use: Yes    Types: Marijuana  . Sexual activity: Yes    Birth control/protection: None, Surgical  Other Topics Concern  . Not on file  Social History Narrative  . Not on file   Social Determinants of Health   Financial Resource Strain:   . Difficulty of Paying Living Expenses:   Food Insecurity:   . Worried About Charity fundraiser in the Last Year:   . Arboriculturist in the Last Year:   Transportation Needs:   . Film/video editor (Medical):   Marland Kitchen Lack of Transportation (Non-Medical):   Physical Activity:   . Days of Exercise per Week:   . Minutes of Exercise per Session:   Stress:   . Feeling of Stress :   Social Connections:   . Frequency of  Communication with Friends and Family:   . Frequency of Social Gatherings with Friends and Family:   . Attends Religious Services:   . Active Member of Clubs or Organizations:   . Attends Archivist Meetings:   Marland Kitchen Marital Status:   Intimate Partner Violence:   . Fear of Current or Ex-Partner:   . Emotionally Abused:   Marland Kitchen Physically Abused:   . Sexually Abused:     Physical Exam: General:   Alert,  well-nourished, pleasant and cooperative in NAD Head:  Normocephalic and atraumatic. Eyes:  Sclera clear, no icterus.   Conjunctiva pink. Ears:  Normal auditory acuity. Nose:  No deformity, discharge,  or lesions. Mouth:  No deformity or lesions.   Neck:  Supple; no masses or thyromegaly. Lungs:  Clear throughout to auscultation.   No wheezes. Heart:  Regular rate and rhythm; no murmurs. Abdomen:  Soft, nontender, nondistended, normal bowel sounds, no rebound or guarding. No hepatosplenomegaly.  I am unable to reproducing her symptoms.  Rectal:  Deferred  Msk:  Symmetrical. No boney deformities LAD: No inguinal or umbilical LAD Extremities:  No clubbing or edema. Neurologic:  Alert and  oriented x4;  grossly nonfocal Skin:  Intact without significant lesions or rashes. Psych:  Alert and cooperative. Normal mood and affect.    Henley Boettner L. Tarri Glenn, MD, MPH 05/24/2020, 8:54 AM

## 2020-05-25 ENCOUNTER — Encounter: Payer: Self-pay | Admitting: Gastroenterology

## 2020-05-31 ENCOUNTER — Telehealth: Payer: Self-pay

## 2020-05-31 DIAGNOSIS — N809 Endometriosis, unspecified: Secondary | ICD-10-CM

## 2020-05-31 MED FILL — CYCLOBENZAPRINE HCL 5 MG TA: 5 | 10 days supply | Qty: 30 | Fill #1

## 2020-05-31 MED FILL — PARoxetine HCL 20 MG TABS: 20 | 30 days supply | Qty: 30 | Fill #1

## 2020-05-31 NOTE — Telephone Encounter (Signed)
I called and left her a detailed message to call us back now that October schedule is open for [facility] to set up a pre-visit and ECL in the [facility].   I also left on her voice mail that we are putting in a referral to [facility] [employer] [facility]. They will contact her about an appointment once the October schedule is open. They can be reached at [phone].

## 2020-07-18 ENCOUNTER — Encounter: Payer: Self-pay | Admitting: Family Medicine

## 2020-07-25 ENCOUNTER — Other Ambulatory Visit: Payer: Self-pay

## 2020-07-25 ENCOUNTER — Encounter: Payer: Self-pay | Admitting: Obstetrics & Gynecology

## 2020-07-25 ENCOUNTER — Other Ambulatory Visit (HOSPITAL_COMMUNITY)
Admission: RE | Admit: 2020-07-25 | Discharge: 2020-07-25 | Disposition: A | Discharge status: Home / Self Care | Payer: Medicaid Other | Source: Ambulatory Visit | Attending: Obstetrics & Gynecology | Admitting: Obstetrics & Gynecology

## 2020-07-25 ENCOUNTER — Ambulatory Visit (INDEPENDENT_AMBULATORY_CARE_PROVIDER_SITE_OTHER): Payer: Medicaid Other | Admitting: Obstetrics & Gynecology

## 2020-07-25 VITALS — BP 125/66 | HR 63 | Ht 66.0 in | Wt 215.8 lb

## 2020-07-25 DIAGNOSIS — Z01419 Encounter for gynecological examination (general) (routine) without abnormal findings: Secondary | ICD-10-CM | POA: Insufficient documentation

## 2020-07-25 DIAGNOSIS — N946 Dysmenorrhea, unspecified: Secondary | ICD-10-CM

## 2020-07-25 DIAGNOSIS — D259 Leiomyoma of uterus, unspecified: Secondary | ICD-10-CM

## 2020-07-25 DIAGNOSIS — L739 Follicular disorder, unspecified: Secondary | ICD-10-CM

## 2020-07-25 DIAGNOSIS — Z23 Encounter for immunization: Secondary | ICD-10-CM

## 2020-07-25 DIAGNOSIS — Z113 Encounter for screening for infections with a predominantly sexual mode of transmission: Secondary | ICD-10-CM | POA: Diagnosis not present

## 2020-07-25 MED ORDER — SULFAMETHOXAZOLE-TRIMETHOPRIM 800-160 MG PO TABS: 1.0000 | ORAL_TABLET | Freq: Two times a day (BID) | ORAL | 0 refills | Status: DC

## 2020-07-25 NOTE — Patient Instructions (Signed)
Endometriosis  Endometriosis is a condition in which the tissue that lines the uterus (endometrium) grows outside of its normal location. The tissue may grow in many locations close to the uterus, but it commonly grows on the ovaries, fallopian tubes, vagina, or bowel. When the uterus sheds the endometrium every menstrual cycle, there is bleeding wherever the endometrial tissue is located. This can cause pain because blood is irritating to tissues that are not normally exposed to it. What are the causes? The cause of endometriosis is not known. What increases the risk? You may be more likely to develop endometriosis if you:  Have a family history of endometriosis.  Have never given birth.  Started your period at age 10 or younger.  Have high levels of estrogen in your body.  Were exposed to a certain medicine (diethylstilbestrol) before you were born (in utero).  Had low birth weight.  Were born as a twin, triplet, or other multiple.  Have a BMI of less than 25. BMI is an estimate of body fat and is calculated from height and weight. What are the signs or symptoms? Often, there are no symptoms of this condition. If you do have symptoms, they may:  Vary depending on where your endometrial tissue is growing.  Occur during your menstrual period (most common) or midcycle.  Come and go, or you may go months with no symptoms at all.  Stop with menopause. Symptoms may include:  Pain in the back or abdomen.  Heavier bleeding during periods.  Pain during sex.  Painful bowel movements.  Infertility.  Pelvic pain.  Bleeding more than once a month. How is this diagnosed? This condition is diagnosed based on your symptoms and a physical exam. You may have tests, such as:  Blood tests and urine tests. These may be done to help rule out other possible causes of your symptoms.  Ultrasound, to look for abnormal tissues.  An X-ray of the lower bowel (barium enema).  An  ultrasound that is done through the vagina (transvaginally).  CT scan.  MRI.  Laparoscopy. In this procedure, a lighted, pencil-sized instrument called a laparoscope is inserted into your abdomen through an incision. The laparoscope allows your health care provider to look at the organs inside your body and check for abnormal tissue to confirm the diagnosis. If abnormal tissue is found, your health care provider may remove a small piece of tissue (biopsy) to be examined under a microscope. How is this treated? Treatment for this condition may include:  Medicines to relieve pain, such as NSAIDs.  Hormone therapy. This involves using artificial (synthetic) hormones to reduce endometrial tissue growth. Your health care provider may recommend using a hormonal form of birth control, or other medicines.  Surgery. This may be done to remove abnormal endometrial tissue. ? In some cases, tissue may be removed using a laparoscope and a laser (laparoscopic laser treatment). ? In severe cases, surgery may be done to remove the fallopian tubes, uterus, and ovaries (hysterectomy). Follow these instructions at home:  Take over-the-counter and prescription medicines only as told by your health care provider.  Do not drive or use heavy machinery while taking prescription pain medicine.  Try to avoid activities that cause pain, including sexual activity.  Keep all follow-up visits as told by your health care provider. This is important. Contact a health care provider if:  You have pain in the area between your hip bones (pelvic area) that occurs: ? Before, during, or after your period. ?   In between your period and gets worse during your period. ? During or after sex. ? With bowel movements or urination, especially during your period.  You have problems getting pregnant.  You have a fever. Get help right away if:  You have severe pain that does not get better with medicine.  You have severe  nausea and vomiting, or you cannot eat without vomiting.  You have pain that affects only the lower, right side of your abdomen.  You have abdominal pain that gets worse.  You have abdominal swelling.  You have blood in your stool. This information is not intended to replace advice given to you by your health care provider. Make sure you discuss any questions you have with your health care provider. Document Revised: 09/27/2017 Document Reviewed: 03/17/2016 Elsevier Patient Education  2020 Elsevier Inc.  

## 2020-07-25 NOTE — Progress Notes (Signed)
Patient ID: Rebecca Watson, female   DOB: [birth year], 39 y.o.   MRN: [id number]  Chief Complaint  Patient presents with  . Gynecologic Exam    HPI Rebecca Watson is a 39 y.o. female.  [id number] Patient's last menstrual period was 07/25/2020 (exact date). S/p BTL 2009, with h/o diverticulosis and abdominal wall endometrioma resection, states menses are regular, sometimes painful and heavy. Questions if she has current endometriosis. Followed by GI, has had diverticulitis HPI  Past Medical History:  Diagnosis Date  . Allergy   . Diverticulitis   . Diverticulosis   . GERD (gastroesophageal reflux disease)   . Obesity   . Thyroid disease     Past Surgical History:  Procedure Laterality Date  . CESAREAN SECTION    . CESAREAN SECTION    . TUBAL LIGATION    . TUBAL LIGATION      Family History  Problem Relation Age of Onset  . Colon cancer Neg Hx   . Esophageal cancer Neg Hx     Social History Social History   Tobacco Use  . Smoking status: Current Every Day Smoker    Types: Cigarettes  . Smokeless tobacco: Never Used  Vaping Use  . Vaping Use: Never used  Substance Use Topics  . Alcohol use: Yes    Comment: ocassionally  . Drug use: Yes    Types: Marijuana    Allergies  Allergen Reactions  . Doxycycline Nausea And Vomiting    Current Outpatient Medications  Medication Sig Dispense Refill  . acetaminophen (TYLENOL) 500 MG tablet Take 1,000 mg by mouth every 6 (six) hours as needed for moderate pain or headache.    . cyclobenzaprine (FLEXERIL) 5 MG tablet Take 1 tablet (5 mg total) by mouth 3 (three) times daily as needed for muscle spasms. 30 tablet 1  . diclofenac sodium (VOLTAREN) 1 % GEL Apply 2 g topically 4 (four) times daily. (Patient taking differently: Apply 2 g topically as needed. ) 100 g 0  . hydrOXYzine (ATARAX/VISTARIL) 50 MG tablet Take 1 tablet (50 mg total) by mouth 3 (three) times daily as needed for anxiety. 90 tablet 1  . PARoxetine  (PAXIL) 20 MG tablet Take 1 tablet (20 mg total) by mouth daily. 30 tablet 2  . dicyclomine (BENTYL) 20 MG tablet Take 1 tablet (20 mg total) by mouth 4 (four) times daily. (Patient not taking: Reported on 07/25/2020) 120 tablet 0  . neomycin-polymyxin-dexamethasone (MAXITROL) 0.1 % ophthalmic suspension as needed. (Patient not taking: Reported on 07/25/2020)     No current facility-administered medications for this visit.    Review of Systems Review of Systems  Constitutional: Negative.   Gastrointestinal: Positive for abdominal distention, abdominal pain, constipation and nausea.  Genitourinary: Positive for difficulty urinating, menstrual problem and vaginal bleeding. Negative for dyspareunia, hematuria, urgency and vaginal discharge.  Psychiatric/Behavioral: Negative.     Blood pressure 125/66, pulse 63, height 5\' 6"  (1.676 m), weight 215 lb 12.8 oz (97.9 kg), last menstrual period 07/25/2020.  Physical Exam Physical Exam Vitals and nursing note reviewed. Exam conducted with a chaperone present.  Constitutional:      Appearance: Normal appearance. She is not ill-appearing.  HENT:     Head: Normocephalic.  Eyes:     Pupils: Pupils are equal, round, and reactive to light.  Cardiovascular:     Rate and Rhythm: Normal rate.  Pulmonary:     Effort: Pulmonary effort is normal.  Abdominal:     General: Abdomen is flat.  There is no distension.     Palpations: Abdomen is soft. There is no mass.  Genitourinary:    General: Normal vulva.     Vagina: No vaginal discharge.     Comments: Uterus 6 week size firm no tenderness or adnexal mass, pap done Musculoskeletal:     Cervical back: Normal range of motion.  Neurological:     Mental Status: She is alert.  Psychiatric:        Mood and Affect: Mood normal.        Behavior: Behavior normal.   Breasts: breasts appear normal, no suspicious masses, no skin or nipple changes or axillary nodes. Left axilla with 1-2 cm follicle  abscess  Data  Narrative & Impression  CLINICAL DATA:  39 year old female with left lower quadrant pelvic pain  EXAM: TRANSABDOMINAL AND TRANSVAGINAL ULTRASOUND OF PELVIS  TECHNIQUE: Both transabdominal and transvaginal ultrasound examinations of the pelvis were performed. Transabdominal technique was performed for global imaging of the pelvis including uterus, ovaries, adnexal regions, and pelvic cul-de-sac. It was necessary to proceed with endovaginal exam following the transabdominal exam to visualize the endometrium and the ovaries.  COMPARISON:  CT dated 04/25/2010  FINDINGS: Uterus  The uterus is anteverted and heterogeneous measuring 9.7 x 5 3 x 5 7 cm. There is a 1.7 x 1.5 x 1.8 cm anterior body mural fibroid and a 1.7 x 2.1 x 1.8 cm posterior frontal sub serosal fibroid.  Endometrium  Thickness: 10 mm.  No focal abnormality visualized.  Right ovary  Measurements: 2.7 x 1.6 x 1.8 cm. Normal appearance/no adnexal mass.  Left ovary  Measurements: 3.4 x 1.7 x 2.8 cm. There is a 2.0 x 1.5 x 1.8 cm dominant follicle/involuting cyst in the left ovary.  Other findings  No abnormal free fluid.  IMPRESSION: A 2 cm left ovarian dominant follicle/cyst.  Two small uterine fibroids.   Electronically Signed   By: Anner Crete M.D.   On: 04/20/2016 06:27   CBC    Component Value Date/Time   WBC 6.2 03/17/2020 1016   RBC 4.60 03/17/2020 1016   HGB 11.0 (L) 03/17/2020 1016   HCT 34.5 (L) 03/17/2020 1016   PLT 293.0 03/17/2020 1016   MCV 74.9 (L) 03/17/2020 1016   MCH 26.0 10/03/2016 2206   MCHC 32.0 03/17/2020 1016   RDW 17.8 (H) 03/17/2020 1016   LYMPHSABS 2.1 10/15/2019 1637   MONOABS 0.4 10/15/2019 1637   EOSABS 0.1 10/15/2019 1637   BASOSABS 0.1 10/15/2019 1637    Reviewed   Assessment Screening for STD (sexually transmitted disease) - Plan: Cytology - PAP( [place]), HIV Antibody (routine testing w rflx), RPR, Flu Vaccine  QUAD 36+ mos IM, Hepatitis B Surface AntiGEN, Hepatitis C Antibody, US PELVIC COMPLETE WITH TRANSVAGINAL  Gynecologic exam normal - Plan: Cytology - PAP( [place])  Folliculitis - Plan: sulfamethoxazole-trimethoprim (BACTRIM DS) 800-160 MG tablet  Dysmenorrhea  Uterine leiomyoma, unspecified location    Plan Treat axillary skin abscess with Bactrim F/u previous pelvic US to assess fibroids and contact patient with result    Emeterio Reeve 07/25/2020, 2:17 PM

## 2020-07-26 LAB — CYTOLOGY - PAP
Adequacy: ABSENT
Chlamydia: NEGATIVE
Comment: NEGATIVE
Comment: NEGATIVE
Comment: NEGATIVE
Comment: NORMAL
Diagnosis: NEGATIVE
High risk HPV: NEGATIVE
Neisseria Gonorrhea: NEGATIVE
Trichomonas: NEGATIVE

## 2020-07-29 ENCOUNTER — Ambulatory Visit: Payer: Medicaid Other | Admitting: Family Medicine

## 2020-08-01 ENCOUNTER — Ambulatory Visit
Admission: RE | Admit: 2020-08-01 | Discharge: 2020-08-01 | Disposition: A | Discharge status: Home / Self Care | Payer: Medicaid Other | Source: Ambulatory Visit | Attending: Obstetrics & Gynecology | Admitting: Obstetrics & Gynecology

## 2020-08-01 ENCOUNTER — Other Ambulatory Visit: Payer: Self-pay

## 2020-08-01 DIAGNOSIS — N946 Dysmenorrhea, unspecified: Secondary | ICD-10-CM | POA: Diagnosis not present

## 2020-08-01 DIAGNOSIS — D252 Subserosal leiomyoma of uterus: Secondary | ICD-10-CM | POA: Diagnosis not present

## 2020-08-01 DIAGNOSIS — N83292 Other ovarian cyst, left side: Secondary | ICD-10-CM | POA: Diagnosis not present

## 2020-08-01 DIAGNOSIS — Z113 Encounter for screening for infections with a predominantly sexual mode of transmission: Secondary | ICD-10-CM | POA: Diagnosis not present

## 2020-08-02 ENCOUNTER — Encounter: Payer: Self-pay | Admitting: Lactation Services

## 2020-08-02 NOTE — Progress Notes (Signed)
Patient left during visit on 9/27 without having labs drawn per [facility] from [place].

## 2020-08-03 MED FILL — PARoxetine HCL 20 MG TABS: 20 | 30 days supply | Qty: 30 | Fill #2

## 2020-09-14 ENCOUNTER — Ambulatory Visit (INDEPENDENT_AMBULATORY_CARE_PROVIDER_SITE_OTHER): Payer: Medicaid Other | Admitting: Obstetrics & Gynecology

## 2020-09-14 ENCOUNTER — Encounter: Payer: Self-pay | Admitting: Obstetrics & Gynecology

## 2020-09-14 ENCOUNTER — Other Ambulatory Visit: Payer: Self-pay

## 2020-09-14 VITALS — BP 130/81 | HR 72 | Wt 217.1 lb

## 2020-09-14 DIAGNOSIS — Z30430 Encounter for insertion of intrauterine contraceptive device: Secondary | ICD-10-CM

## 2020-09-14 DIAGNOSIS — D259 Leiomyoma of uterus, unspecified: Secondary | ICD-10-CM | POA: Diagnosis not present

## 2020-09-14 DIAGNOSIS — K5909 Other constipation: Secondary | ICD-10-CM | POA: Diagnosis not present

## 2020-09-14 DIAGNOSIS — Z3202 Encounter for pregnancy test, result negative: Secondary | ICD-10-CM

## 2020-09-14 LAB — POCT PREGNANCY, URINE: Preg Test, Ur: NEGATIVE

## 2020-09-14 MED ORDER — LEVONORGESTREL 19.5 MCG/DAY IU IUD
INTRAUTERINE_SYSTEM | Freq: Once | INTRAUTERINE | Status: AC
  Administered 2020-09-14: 1 via INTRAUTERINE

## 2020-09-14 NOTE — Addendum Note (Signed)
Addended by: Louisa Second E on: 09/14/2020 05:20 PM   Modules accepted: Orders

## 2020-09-14 NOTE — Patient Instructions (Signed)
Levonorgestrel intrauterine device (IUD) What is this medicine? LEVONORGESTREL IUD (LEE voe nor jes trel) is a contraceptive (birth control) device. The device is placed inside the uterus by a healthcare professional. It is used to prevent pregnancy. This device can also be used to treat heavy bleeding that occurs during your period. This medicine may be used for other purposes; ask your health care provider or pharmacist if you have questions. COMMON BRAND NAME(S): Kyleena, LILETTA, Mirena, Skyla What should I tell my health care provider before I take this medicine? They need to know if you have any of these conditions:  abnormal Pap smear  cancer of the breast, uterus, or cervix  diabetes  endometritis  genital or pelvic infection now or in the past  have more than one sexual partner or your partner has more than one partner  heart disease  history of an ectopic or tubal pregnancy  immune system problems  IUD in place  liver disease or tumor  problems with blood clots or take blood-thinners  seizures  use intravenous drugs  uterus of unusual shape  vaginal bleeding that has not been explained  an unusual or allergic reaction to levonorgestrel, other hormones, silicone, or polyethylene, medicines, foods, dyes, or preservatives  pregnant or trying to get pregnant  breast-feeding How should I use this medicine? This device is placed inside the uterus by a health care professional. Talk to your pediatrician regarding the use of this medicine in children. Special care may be needed. Overdosage: If you think you have taken too much of this medicine contact a poison control center or emergency room at once. NOTE: This medicine is only for you. Do not share this medicine with others. What if I miss a dose? This does not apply. Depending on the brand of device you have inserted, the device will need to be replaced every 3 to 6 years if you wish to continue using this type  of birth control. What may interact with this medicine? Do not take this medicine with any of the following medications:  amprenavir  bosentan  fosamprenavir This medicine may also interact with the following medications:  aprepitant  armodafinil  barbiturate medicines for inducing sleep or treating seizures  bexarotene  boceprevir  griseofulvin  medicines to treat seizures like carbamazepine, ethotoin, felbamate, oxcarbazepine, phenytoin, topiramate  modafinil  pioglitazone  rifabutin  rifampin  rifapentine  some medicines to treat HIV infection like atazanavir, efavirenz, indinavir, lopinavir, nelfinavir, tipranavir, ritonavir  St. John's wort  warfarin This list may not describe all possible interactions. Give your health care provider a list of all the medicines, herbs, non-prescription drugs, or dietary supplements you use. Also tell them if you smoke, drink alcohol, or use illegal drugs. Some items may interact with your medicine. What should I watch for while using this medicine? Visit your doctor or health care professional for regular check ups. See your doctor if you or your partner has sexual contact with others, becomes HIV positive, or gets a sexual transmitted disease. This product does not protect you against HIV infection (AIDS) or other sexually transmitted diseases. You can check the placement of the IUD yourself by reaching up to the top of your vagina with clean fingers to feel the threads. Do not pull on the threads. It is a good habit to check placement after each menstrual period. Call your doctor right away if you feel more of the IUD than just the threads or if you cannot feel the threads at   all. The IUD may come out by itself. You may become pregnant if the device comes out. If you notice that the IUD has come out use a backup birth control method like condoms and call your health care provider. Using tampons will not change the position of the  IUD and are okay to use during your period. This IUD can be safely scanned with magnetic resonance imaging (MRI) only under specific conditions. Before you have an MRI, tell your healthcare provider that you have an IUD in place, and which type of IUD you have in place. What side effects may I notice from receiving this medicine? Side effects that you should report to your doctor or health care professional as soon as possible:  allergic reactions like skin rash, itching or hives, swelling of the face, lips, or tongue  fever, flu-like symptoms  genital sores  high blood pressure  no menstrual period for 6 weeks during use  pain, swelling, warmth in the leg  pelvic pain or tenderness  severe or sudden headache  signs of pregnancy  stomach cramping  sudden shortness of breath  trouble with balance, talking, or walking  unusual vaginal bleeding, discharge  yellowing of the eyes or skin Side effects that usually do not require medical attention (report to your doctor or health care professional if they continue or are bothersome):  acne  breast pain  change in sex drive or performance  changes in weight  cramping, dizziness, or faintness while the device is being inserted  headache  irregular menstrual bleeding within first 3 to 6 months of use  nausea This list may not describe all possible side effects. Call your doctor for medical advice about side effects. You may report side effects to FDA at 1-800-FDA-1088. Where should I keep my medicine? This does not apply. NOTE: This sheet is a summary. It may not cover all possible information. If you have questions about this medicine, talk to your doctor, pharmacist, or health care provider.  2020 Elsevier/Gold Standard (2018-08-26 13:22:01)  

## 2020-09-14 NOTE — Progress Notes (Signed)
    GYNECOLOGY OFFICE PROCEDURE NOTE  Rebecca Watson is a 39 y.o. [id number] here for f/u after [place] that showed uterine fibroids, larger since last [place] in 2017. She has heavy painful menses as well as epigastric pain and constipation. We discussed surgical management vs trial of LNGIUD. She has had a Paragard in the past. She requests [place] IUD insertion.   Last pap smear 07/25/20 and was normal.  IUD Insertion Procedure Note Patient identified, informed consent performed, consent signed.   Discussed risks of irregular bleeding, cramping, infection, malpositioning or misplacement of the IUD outside the uterus which may require further procedure such as laparoscopy. Time out was performed.  Urine pregnancy test negative.  Speculum placed in the vagina.  Cervix visualized.  Cleaned with Betadine x 2.  Grasped anteriorly with a single tooth tenaculum.  Uterus sounded to 9 cm. 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  Strings trimmed to 3 cm. Tenaculum was removed, good hemostasis noted.  Patient tolerated procedure but had significant discomfort..   Patient was given post-procedure instructions. Patient was also asked to check IUD strings periodically and follow up in 4 weeks for IUD check.   Emeterio Reeve, MD, [place] [facility] [place], [facility] for [facility], [place]

## 2020-09-29 ENCOUNTER — Other Ambulatory Visit: Payer: Self-pay

## 2020-09-29 ENCOUNTER — Encounter: Payer: Self-pay | Admitting: Family Medicine

## 2020-09-29 MED ORDER — DICYCLOMINE HCL 20 MG PO TABS
20.0000 mg | ORAL_TABLET | Freq: Four times a day (QID) | ORAL | 0 refills | Status: DC
Start: 2020-09-29 — End: 2021-01-30

## 2020-10-05 ENCOUNTER — Encounter: Payer: Self-pay | Admitting: *Deleted

## 2020-10-05 ENCOUNTER — Other Ambulatory Visit: Payer: Self-pay

## 2020-10-05 ENCOUNTER — Telehealth: Payer: Self-pay

## 2020-10-05 DIAGNOSIS — K5900 Constipation, unspecified: Secondary | ICD-10-CM

## 2020-10-05 DIAGNOSIS — K5732 Diverticulitis of large intestine without perforation or abscess without bleeding: Secondary | ICD-10-CM

## 2020-10-05 DIAGNOSIS — R109 Unspecified abdominal pain: Secondary | ICD-10-CM

## 2020-10-05 NOTE — Telephone Encounter (Signed)
Upon review of pt chart for upcoming 10/19/20 appt, noticed pt was scheduled for f/u - medication refill per My Chart message. However, My Chart message indicated a request to refill Paxil. Called pt to inquire further. Confirmed she was needing a refill for Paxil but was aware this is a medication we do not manage. Also reviewed previous OV (05/24/20) with Dr. Tarri Glenn. Advised pt, according to Dr. Tarri Glenn plan, EGD and colonoscopy were to be scheduled. Prior to scheduling EGD and colonoscopy, a repea CT would be required. Advised it appears our office did attempt to call her to schedule but we did not receive a returned call to do so. Asked if she wished to proceed with Dr. Tarri Glenn recommendations. Pt agreed to proceed with CT and to schedule EGD and colonoscopy following results. Advised I will cancel the 10/19/20 appt with Dr. Tarri Glenn as this is not required. Advised I will arrange her CT w/ labs needed prior to her CT. Advised I will mail all info to her home address (confirmed mailing address) as well as communicate these appts via My Chart. Verbalized acceptance and understanding. Will await results of CT before scheduling EGD and colonoscopy per Dr. Tarri Glenn orders.

## 2020-10-12 ENCOUNTER — Ambulatory Visit (INDEPENDENT_AMBULATORY_CARE_PROVIDER_SITE_OTHER): Payer: Medicaid Other | Admitting: Family Medicine

## 2020-10-12 ENCOUNTER — Other Ambulatory Visit (HOSPITAL_COMMUNITY)
Admission: RE | Admit: 2020-10-12 | Discharge: 2020-10-12 | Disposition: A | Discharge status: Home / Self Care | Payer: Medicaid Other | Source: Ambulatory Visit | Attending: Family Medicine | Admitting: Family Medicine

## 2020-10-12 ENCOUNTER — Other Ambulatory Visit: Payer: Self-pay

## 2020-10-12 ENCOUNTER — Encounter: Payer: Self-pay | Admitting: Family Medicine

## 2020-10-12 ENCOUNTER — Other Ambulatory Visit: Payer: Self-pay | Admitting: Family Medicine

## 2020-10-12 VITALS — BP 138/84 | HR 78 | Ht 65.0 in | Wt 204.0 lb

## 2020-10-12 DIAGNOSIS — F419 Anxiety disorder, unspecified: Secondary | ICD-10-CM | POA: Diagnosis not present

## 2020-10-12 DIAGNOSIS — Z975 Presence of (intrauterine) contraceptive device: Secondary | ICD-10-CM | POA: Diagnosis not present

## 2020-10-12 DIAGNOSIS — N898 Other specified noninflammatory disorders of vagina: Secondary | ICD-10-CM

## 2020-10-12 DIAGNOSIS — F32A Depression, unspecified: Secondary | ICD-10-CM

## 2020-10-12 MED ORDER — PAROXETINE HCL 20 MG PO TABS: 20.0000 mg | ORAL_TABLET | Freq: Every day | ORAL | 5 refills | Status: DC

## 2020-10-12 MED ORDER — NORGESTIM-ETH ESTRAD TRIPHASIC 0.18/0.215/0.25 MG-25 MCG PO TABS: 1.0000 | ORAL_TABLET | Freq: Every day | ORAL | 1 refills | Status: DC

## 2020-10-12 MED FILL — PARoxetine HCL 20 MG TABS: 20 | 30 days supply | Qty: 30 | Fill #0

## 2020-10-12 MED FILL — TRI-LO-SPRINTEC TABLET: 0.18/0.215/ | 28 days supply | Qty: 28 | Fill #0

## 2020-10-12 NOTE — Assessment & Plan Note (Addendum)
Strings seen, well positioned IUD. Some bothersome spotting, will trial cycle of OCP's to see if this improves symptoms.

## 2020-10-12 NOTE — Assessment & Plan Note (Signed)
Swab collected per patient request

## 2020-10-12 NOTE — Assessment & Plan Note (Signed)
Refill of paxil given, encouraged to reconnect with PCP for ongoing management of this issue

## 2020-10-12 NOTE — Progress Notes (Signed)
   GYNECOLOGY OFFICE VISIT NOTE  History:   Rebecca Watson is a 39 y.o. [phone] here today for IUD string check.  Would like to get strings checked Has also been having spotting since placement that is bothering her Worried she may have an infection  Also wants to know about refill for her Paxil, she stopped taking it two weeks prior and is feeling more anxious and tired  Past Medical History:  Diagnosis Date  . Allergy   . Diverticulitis   . Diverticulosis   . GERD (gastroesophageal reflux disease)   . Obesity   . Thyroid disease     Past Surgical History:  Procedure Laterality Date  . CESAREAN SECTION    . CESAREAN SECTION    . TUBAL LIGATION    . TUBAL LIGATION      The following portions of the patient's history were reviewed and updated as appropriate: allergies, current medications, past family history, past medical history, past social history, past surgical history and problem list.   Health Maintenance:  Normal pap and negative HRHPV: 07/25/2020.  Normal mammogram: n/a.   Review of Systems:  Pertinent items noted in HPI and remainder of comprehensive ROS otherwise negative.  Physical Exam:  BP 138/84   Pulse 78   Ht 5\' 5"  (1.651 m)   Wt 204 lb (92.5 kg)   LMP 09/14/2020 (Exact Date)   BMI 33.95 kg/m  CONSTITUTIONAL: Well-developed, well-nourished female in no acute distress.  HEENT:  Normocephalic, atraumatic. External right and left ear normal. No scleral icterus.  NECK: Normal range of motion, supple, no masses noted on observation SKIN: No rash noted. Not diaphoretic. No erythema. No pallor. MUSCULOSKELETAL: Normal range of motion. No edema noted. NEUROLOGIC: Alert and oriented to person, place, and time. Normal muscle tone coordination.  PSYCHIATRIC: Normal mood and affect. Normal behavior. Normal judgment and thought content. RESPIRATORY: Effort normal, no problems with respiration noted PELVIC: Normal appearing external genitalia; normal appearing  vaginal mucosa and cervix.  No abnormal discharge noted, small amount of blood in vault. IUD strings visualized, ~3cm in length  Labs and Imaging No results found for this or any previous visit (from the past 168 hour(s)). No results found.    Assessment and Plan:   Problem List Items Addressed This Visit      Other   Vaginal discharge - Primary    Swab collected per patient request      Relevant Orders   Cervicovaginal ancillary only( [facility])   Anxiety and depression    Refill of paxil given, encouraged to reconnect with PCP for ongoing management of this issue      Relevant Medications   PARoxetine (PAXIL) 20 MG tablet   IUD (intrauterine device) in place    Strings seen, well positioned IUD. Some bothersome spotting, will trial cycle of OCP's to see if this improves symptoms.          Routine preventative health maintenance measures emphasized. Please refer to After Visit Summary for other counseling recommendations.   Return if symptoms worsen or fail to improve.    Total face-to-face time with patient: 20 minutes.  Over 50% of encounter was spent on counseling and coordination of care.   Clarnce Flock, MD/[facility] for [employer], [place]

## 2020-10-14 ENCOUNTER — Other Ambulatory Visit: Payer: Self-pay | Admitting: Family Medicine

## 2020-10-14 LAB — CERVICOVAGINAL ANCILLARY ONLY
Bacterial Vaginitis (gardnerella): POSITIVE — AB
Candida Glabrata: NEGATIVE
Candida Vaginitis: NEGATIVE
Comment: NEGATIVE
Comment: NEGATIVE
Comment: NEGATIVE
Comment: NEGATIVE
Trichomonas: NEGATIVE

## 2020-10-14 MED ORDER — METRONIDAZOLE 500 MG PO TABS: 500.0000 mg | ORAL_TABLET | Freq: Two times a day (BID) | ORAL | 0 refills | Status: AC

## 2020-10-18 ENCOUNTER — Ambulatory Visit (HOSPITAL_COMMUNITY): Payer: Medicaid Other

## 2020-10-19 ENCOUNTER — Ambulatory Visit: Payer: Medicaid Other | Admitting: Gastroenterology

## 2020-10-31 ENCOUNTER — Encounter (HOSPITAL_COMMUNITY): Payer: Self-pay

## 2020-10-31 ENCOUNTER — Ambulatory Visit (HOSPITAL_COMMUNITY)
Admission: RE | Admit: 2020-10-31 | Discharge: 2020-10-31 | Disposition: A | Discharge status: Home / Self Care | Payer: Medicaid Other | Source: Ambulatory Visit | Attending: Gastroenterology | Admitting: Gastroenterology

## 2020-10-31 ENCOUNTER — Other Ambulatory Visit: Payer: Self-pay

## 2020-10-31 DIAGNOSIS — K5900 Constipation, unspecified: Secondary | ICD-10-CM | POA: Diagnosis not present

## 2020-10-31 DIAGNOSIS — R109 Unspecified abdominal pain: Secondary | ICD-10-CM | POA: Diagnosis not present

## 2020-10-31 DIAGNOSIS — K5732 Diverticulitis of large intestine without perforation or abscess without bleeding: Secondary | ICD-10-CM | POA: Diagnosis not present

## 2020-10-31 MED ORDER — IOHEXOL 300 MG/ML  SOLN
100.0000 mL | Freq: Once | INTRAMUSCULAR | Status: AC | PRN
  Administered 2020-10-31: 100 mL via INTRAVENOUS

## 2020-11-03 ENCOUNTER — Other Ambulatory Visit: Payer: Self-pay

## 2020-11-03 DIAGNOSIS — D219 Benign neoplasm of connective and other soft tissue, unspecified: Secondary | ICD-10-CM

## 2020-11-03 DIAGNOSIS — R1010 Upper abdominal pain, unspecified: Secondary | ICD-10-CM

## 2020-11-04 ENCOUNTER — Other Ambulatory Visit: Payer: Self-pay

## 2020-11-04 DIAGNOSIS — K5732 Diverticulitis of large intestine without perforation or abscess without bleeding: Secondary | ICD-10-CM

## 2020-11-04 DIAGNOSIS — K219 Gastro-esophageal reflux disease without esophagitis: Secondary | ICD-10-CM

## 2020-11-04 DIAGNOSIS — Z1211 Encounter for screening for malignant neoplasm of colon: Secondary | ICD-10-CM

## 2020-11-09 ENCOUNTER — Telehealth: Payer: Self-pay

## 2020-11-09 NOTE — Telephone Encounter (Signed)
Received notification from [facility] indicating they cannot schedule pt for an appt because they do not accept Medicaid. Called pt and LVM requesting returned call.

## 2020-11-10 MED FILL — PARoxetine HCL 20 MG TABS: 20 | 30 days supply | Qty: 30 | Fill #1

## 2020-11-11 ENCOUNTER — Other Ambulatory Visit: Payer: Self-pay | Admitting: Family Medicine

## 2020-11-11 ENCOUNTER — Telehealth (INDEPENDENT_AMBULATORY_CARE_PROVIDER_SITE_OTHER): Payer: Medicaid Other | Admitting: Family Medicine

## 2020-11-11 ENCOUNTER — Encounter: Payer: Self-pay | Admitting: Family Medicine

## 2020-11-11 DIAGNOSIS — F419 Anxiety disorder, unspecified: Secondary | ICD-10-CM | POA: Diagnosis not present

## 2020-11-11 DIAGNOSIS — F32A Depression, unspecified: Secondary | ICD-10-CM | POA: Diagnosis not present

## 2020-11-11 MED ORDER — PAROXETINE HCL 20 MG PO TABS
20.0000 mg | ORAL_TABLET | Freq: Every day | ORAL | 2 refills | Status: DC
Start: 2020-11-11 — End: 2020-11-11

## 2020-11-11 MED FILL — TRI-LO-SPRINTEC TABLET: 0.18/0.215/ | 28 days supply | Qty: 28 | Fill #1

## 2020-11-11 NOTE — Progress Notes (Signed)
Virtual Visit via Telephone Note  I connected with Rebecca Watson on 11/11/20 at  8:00 AM EST by telephone and verified that I am speaking with the correct person using two identifiers.   I discussed the limitations, risks, security and privacy concerns of performing an evaluation and management service by telephone and the availability of in person appointments. I also discussed with the patient that there may be a patient responsible charge related to this service. The patient expressed understanding and agreed to proceed.  Location patient: home Location provider: work or home office Participants present for the call: patient, provider Patient did not have a visit in the prior 7 days to address this/these issue(s).   History of Present Illness: Pt is a 40 yo female with pmh sig for GERD, endometriosis, anxiety and depression see for f/u.  Pt needs refill on Paxil.  Pt does not want to make any changes at this time.  Expresses frustration.  States her anxiety level would be lower if she was not having spotting since Nov.  Had an IUD placed at that time.  Still having abdominal cramping and bowel issues.  Has an endoscopy and colonoscopy in a few wks.  Pt states she does not sleep at times for 3 nights in a row.  Pt does not think she needs counseling or to see [facility].   Observations/Objective: Patient sounds cheerful and well on the phone. I do not appreciate any SOB. Speech and thought processing are grossly intact. Patient reported vitals:  Assessment and Plan: Anxiety and depression  -Pt advised to f/u with Psychiatry given insomnia and increased anxiety.  Pt declines. -PHQ-9 score 3 -GAD-7 score 6 -Paxil 20 mg refilled, however per chart review medication was filled by OB/GYN provider in December with refills - Plan: PARoxetine (PAXIL) 20 MG tablet   Follow Up Instructions: F/u in 2-3 months  I did not refer this patient for an OV in the next 24 hours for this/these  issue(s).  I discussed the assessment and treatment plan with the patient. The patient was provided an opportunity to ask questions and all were answered. The patient agreed with the plan and demonstrated an understanding of the instructions.   The patient was advised to call back or seek an in-person evaluation if the symptoms worsen or if the condition fails to improve as anticipated.  I provided 7 minutes of non-face-to-face time during this encounter.   Billie Ruddy, MD

## 2020-11-17 ENCOUNTER — Ambulatory Visit (AMBULATORY_SURGERY_CENTER): Payer: Self-pay | Admitting: *Deleted

## 2020-11-17 ENCOUNTER — Other Ambulatory Visit: Payer: Self-pay

## 2020-11-17 ENCOUNTER — Other Ambulatory Visit: Payer: Self-pay | Admitting: Gastroenterology

## 2020-11-17 VITALS — Ht 65.0 in | Wt 212.0 lb

## 2020-11-17 DIAGNOSIS — R109 Unspecified abdominal pain: Secondary | ICD-10-CM

## 2020-11-17 DIAGNOSIS — K5900 Constipation, unspecified: Secondary | ICD-10-CM

## 2020-11-17 MED ORDER — POLYETHYLENE GLYCOL 3350 17 GM/SCOOP PO POWD: 1.0000 | ORAL | 0 refills | Status: DC

## 2020-11-17 MED ORDER — SUPREP BOWEL PREP KIT 17.5-3.13-1.6 GM/177ML PO SOLN: 1.0000 | Freq: Once | ORAL | 0 refills | Status: DC

## 2020-11-17 MED ORDER — DULCOLAX 5 MG PO TBEC: 5.0000 mg | DELAYED_RELEASE_TABLET | Freq: Once | ORAL | 0 refills | Status: DC

## 2020-11-17 MED FILL — SUPREP BOWEL PREP KIT: 17.5-3.13-1 | 1 days supply | Qty: 354 | Fill #0

## 2020-11-17 NOTE — Progress Notes (Signed)
No egg or soy allergy known to patient  No issues with past sedation with any surgeries or procedures No intubation problems in the past  No FH of Malignant Hyperthermia No diet pills per patient No home 02 use per patient  No blood thinners per patient  Pt states issues with constipation - pt to have a 2 day prep  No A fib or A flutter  EMMI video to pt or via [place] 19 guidelines implemented in PV today with Pt and RN  Pt is fully vaccinated  for Covid   Due to the COVID-19 pandemic we are asking patients to follow certain guidelines.  Pt aware of COVID protocols and [facility] guidelines

## 2020-12-06 ENCOUNTER — Other Ambulatory Visit: Payer: Self-pay | Admitting: Family Medicine

## 2020-12-06 MED FILL — PARoxetine HCL 20 MG TABS: 20 | 30 days supply | Qty: 30 | Fill #2

## 2020-12-14 ENCOUNTER — Other Ambulatory Visit: Payer: Self-pay

## 2020-12-14 ENCOUNTER — Encounter: Payer: Self-pay | Admitting: Gastroenterology

## 2020-12-14 ENCOUNTER — Ambulatory Visit (AMBULATORY_SURGERY_CENTER): Payer: Medicaid Other | Admitting: Gastroenterology

## 2020-12-14 ENCOUNTER — Other Ambulatory Visit: Payer: Self-pay | Admitting: Gastroenterology

## 2020-12-14 VITALS — BP 117/73 | HR 76 | Temp 97.3°F | Resp 21 | Ht 65.0 in | Wt 212.0 lb

## 2020-12-14 DIAGNOSIS — R1084 Generalized abdominal pain: Secondary | ICD-10-CM | POA: Diagnosis not present

## 2020-12-14 DIAGNOSIS — K5730 Diverticulosis of large intestine without perforation or abscess without bleeding: Secondary | ICD-10-CM

## 2020-12-14 DIAGNOSIS — K5900 Constipation, unspecified: Secondary | ICD-10-CM | POA: Diagnosis not present

## 2020-12-14 DIAGNOSIS — K2970 Gastritis, unspecified, without bleeding: Secondary | ICD-10-CM | POA: Diagnosis not present

## 2020-12-14 DIAGNOSIS — K2100 Gastro-esophageal reflux disease with esophagitis, without bleeding: Secondary | ICD-10-CM | POA: Diagnosis not present

## 2020-12-14 DIAGNOSIS — K529 Noninfective gastroenteritis and colitis, unspecified: Secondary | ICD-10-CM

## 2020-12-14 DIAGNOSIS — K2289 Other specified disease of esophagus: Secondary | ICD-10-CM | POA: Diagnosis not present

## 2020-12-14 DIAGNOSIS — K2950 Unspecified chronic gastritis without bleeding: Secondary | ICD-10-CM | POA: Diagnosis not present

## 2020-12-14 MED ORDER — PANTOPRAZOLE SODIUM 40 MG PO TBEC: 40.0000 mg | DELAYED_RELEASE_TABLET | Freq: Every day | ORAL | 1 refills | Status: DC

## 2020-12-14 MED ORDER — SODIUM CHLORIDE 0.9 % IV SOLN
500.0000 mL | Freq: Once | INTRAVENOUS | Status: DC
Start: 2020-12-14 — End: 2020-12-14

## 2020-12-14 MED FILL — PANTOPRAZOLE SOD DR 40 MG T: 40 | 60 days supply | Qty: 60 | Fill #0

## 2020-12-14 NOTE — Progress Notes (Signed)
AR - Check-in  CW - VS  Pt's states no medical or surgical changes since previsit or office visit. 

## 2020-12-14 NOTE — Op Note (Signed)
[place] Patient Name: Rebecca Watson Procedure Date: 12/14/2020 2:00 PM MRN: [id number] Endoscopist: Thornton Park MD, MD Age: 40 Referring MD:  Date of Birth: 19-May-1981 Gender: Female Account #: [id number] Procedure:                Colonoscopy Indications:              LLQ/groin pain and epigastric abdominal pain                           - normal liver enzymes and lipase                           Constipation with possible pelvic floor dysfunction                           Endometriosis                           Diverticulitis by CT 2011 and 2017 - mid-descending                            colon and in the proximal sigmoid colon Medicines:                Monitored Anesthesia Care Procedure:                Pre-Anesthesia Assessment:                           - Prior to the procedure, a History and Physical                            was performed, and patient medications and                            allergies were reviewed. The patient's tolerance of                            previous anesthesia was also reviewed. The risks                            and benefits of the procedure and the sedation                            options and risks were discussed with the patient.                            All questions were answered, and informed consent                            was obtained. Prior Anticoagulants: The patient has                            taken no previous anticoagulant or antiplatelet  agents. ASA Grade Assessment: II - A patient with                            mild systemic disease. After reviewing the risks                            and benefits, the patient was deemed in                            satisfactory condition to undergo the procedure.                           After obtaining informed consent, the colonoscope                            was passed under direct vision. Throughout the                             procedure, the patient's blood pressure, pulse, and                            oxygen saturations were monitored continuously. The                            Olympus CF-[id number] (#[id number]) Colonoscope was                            introduced through the anus and advanced to the 2                            cm into the ileum. A second forward view of the                            right colon was performed. The colonoscopy was                            performed without difficulty. The patient tolerated                            the procedure well. The quality of the bowel                            preparation was good. The terminal ileum, ileocecal                            valve, appendiceal orifice, and rectum were                            photographed. Scope In: 2:36:30 PM Scope Out: 2:48:44 PM Scope Withdrawal Time: 0 hours 9 minutes 13 seconds  Total Procedure Duration: 0 hours 12 minutes 14 seconds  Findings:                 The perianal and digital rectal examinations were  normal.                           Multiple small and large-mouthed diverticula were                            found throughout the entire colon, but most dense                            in the sigmoid and descending colon.                           A diffuse area of moderately altered vascular,                            congested, erythematous and granular mucosa was                            found in the sigmoid colon in the area of most                            dense diverticulosis. Biopsies were taken with a                            cold forceps for histology. Estimated blood loss                            was minimal.                           The exam was otherwise without abnormality on                            direct and retroflexion views. Complications:            No immediate complications. Estimated blood loss:                            Minimal. Estimated  Blood Loss:     Estimated blood loss was minimal. Impression:               - Diverticulosis in the sigmoid colon, in the                            descending colon and in the entire examined colon.                           - Mild erythema in the sigmoid colon, in the area                            of the most dense diverticulosis. Biopsied for                            segmental-colitis associated with diverticulosis.                           -  The examination was otherwise normal on direct                            and retroflexion views. Recommendation:           - Patient has a contact number available for                            emergencies. The signs and symptoms of potential                            delayed complications were discussed with the                            patient. Return to normal activities tomorrow.                            Written discharge instructions were provided to the                            patient.                           - Resume previous diet.                           - Continue present medications. Use a daily stool                            bulking agent such as Metamucil, Benefiber, or                            Citrucel.                           - Await pathology results.                           - Repeat colonoscopy in 10 years for colon cancer                            surveillance.                           - Emerging evidence supports eating a diet of                            fruits, vegetables, grains, calcium, and yogurt                            while reducing red meat and alcohol may reduce the                            risk of colon cancer.                           - Thank you  for allowing me to be involved in your                            colon cancer prevention.                           - Please call the office to schedule a follow-up                            appointment. Thornton Park MD,  MD 12/14/2020 3:01:11 PM This report has been signed electronically.

## 2020-12-14 NOTE — Op Note (Signed)
[place] Patient Name: Rebecca Watson Procedure Date: 12/14/2020 2:00 PM MRN: [id number] Endoscopist: Thornton Park MD, MD Age: 40 Referring MD:  Date of Birth: Oct 23, 1981 Gender: Female Account #: [id number] Procedure:                Upper GI endoscopy Indications:              Epigastric abdominal pain Medicines:                Monitored Anesthesia Care Procedure:                Pre-Anesthesia Assessment:                           - Prior to the procedure, a History and Physical                            was performed, and patient medications and                            allergies were reviewed. The patient's tolerance of                            previous anesthesia was also reviewed. The risks                            and benefits of the procedure and the sedation                            options and risks were discussed with the patient.                            All questions were answered, and informed consent                            was obtained. Prior Anticoagulants: The patient has                            taken no previous anticoagulant or antiplatelet                            agents. ASA Grade Assessment: II - A patient with                            mild systemic disease. After reviewing the risks                            and benefits, the patient was deemed in                            satisfactory condition to undergo the procedure.                           After obtaining informed consent, the endoscope was  passed under direct vision. Throughout the                            procedure, the patient's blood pressure, pulse, and                            oxygen saturations were monitored continuously. The                            Endoscope was introduced through the mouth, and                            advanced to the third part of duodenum. The upper                            GI endoscopy was  accomplished without difficulty.                            The patient tolerated the procedure well. Scope In: Scope Out: Findings:                 LA Grade A (one or more mucosal breaks less than 5                            mm, not extending between tops of 2 mucosal folds)                            esophagitis with no bleeding was found 37 cm from                            the incisors. Biopsies were taken with a cold                            forceps for histology. Estimated blood loss was                            minimal.                           Diffuse mildly erythematous mucosa was found in the                            gastric body. Biopsies were taken from the antrum,                            body, and fundus with a cold forceps for histology.                            Estimated blood loss was minimal.                           The examined duodenum was normal. Biopsies were  taken with a cold forceps for histology. Estimated                            blood loss was minimal. Complications:            The patient had a dry cough during the procedure.                            Estimated blood loss: Minimal. Estimated Blood Loss:     Estimated blood loss was minimal. Impression:               - LA Grade A esophagitis with no bleeding. Biopsied.                           - Erythematous mucosa in the gastric body. Biopsied.                           - Normal examined duodenum. Biopsied. Recommendation:           - Patient has a contact number available for                            emergencies. The signs and symptoms of potential                            delayed complications were discussed with the                            patient. Return to normal activities tomorrow.                            Written discharge instructions were provided to the                            patient.                           - Resume previous diet.                            - Continue present medications.                           - Await pathology results.                           - Start pantoprazole 40 mg QAM x 12 weeks.                           - No aspirin, ibuprofen, naproxen, or other                            non-steroidal anti-inflammatory drugs.                           - Proceed with colonoscopy as previously planned.                           -  Please call the office to schedule a follow-up                            appointment. Thornton Park MD, MD 12/14/2020 2:55:53 PM This report has been signed electronically.

## 2020-12-14 NOTE — Progress Notes (Signed)
Called to room to assist during endoscopic procedure.  Patient ID and intended procedure confirmed with present staff. Received instructions for my participation in the procedure from the performing physician.  

## 2020-12-14 NOTE — Progress Notes (Signed)
Report to PACU, RN, vss, BBS= Clear.  

## 2020-12-14 NOTE — Patient Instructions (Signed)
Please read handouts provided. Continue present medications. Await pathology results. Start Pantoprazole 40 mg every morning for 12 weeks. No aspirin, ibuprofen, naproxen, or other non-steriodal anti-inflammatory  Drugs. Please call th GI office to schedule a follow-up appointment. Use a daily stool bulking agent such as Metamucil, Benefiber, or Citrucel.       YOU HAD AN ENDOSCOPIC PROCEDURE TODAY AT [facility]:   Refer to the procedure report that was given to you for any specific questions about what was found during the examination.  If the procedure report does not answer your questions, please call your gastroenterologist to clarify.  If you requested that your care partner not be given the details of your procedure findings, then the procedure report has been included in a sealed envelope for you to review at your convenience later.  YOU SHOULD EXPECT: Some feelings of bloating in the abdomen. Passage of more gas than usual.  Walking can help get rid of the air that was put into your GI tract during the procedure and reduce the bloating. If you had a lower endoscopy (such as a colonoscopy or flexible sigmoidoscopy) you may notice spotting of blood in your stool or on the toilet paper. If you underwent a bowel prep for your procedure, you may not have a normal bowel movement for a few days.  Please Note:  You might notice some irritation and congestion in your nose or some drainage.  This is from the oxygen used during your procedure.  There is no need for concern and it should clear up in a day or so.  SYMPTOMS TO REPORT IMMEDIATELY:   Following lower endoscopy (colonoscopy or flexible sigmoidoscopy):  Excessive amounts of blood in the stool  Significant tenderness or worsening of abdominal pains  Swelling of the abdomen that is new, acute  Fever of 100F or higher   Following upper endoscopy (EGD)  Vomiting of blood or coffee ground material  New chest pain or  pain under the shoulder blades  Painful or persistently difficult swallowing  New shortness of breath  Fever of 100F or higher  Black, tarry-looking stools  For urgent or emergent issues, a gastroenterologist can be reached at any hour by calling [phone]. Do not use MyChart messaging for urgent concerns.    DIET:  We do recommend a small meal at first, but then you may proceed to your regular diet.  Drink plenty of fluids but you should avoid alcoholic beverages for 24 hours.  ACTIVITY:  You should plan to take it easy for the rest of today and you should NOT DRIVE or use heavy machinery until tomorrow (because of the sedation medicines used during the test).    FOLLOW UP: Our staff will call the number listed on your records 48-72 hours following your procedure to check on you and address any questions or concerns that you may have regarding the information given to you following your procedure. If we do not reach you, we will leave a message.  We will attempt to reach you two times.  During this call, we will ask if you have developed any symptoms of COVID 19. If you develop any symptoms (ie: fever, flu-like symptoms, shortness of breath, cough etc.) before then, please call [phone].  If you test positive for Covid 19 in the 2 weeks post procedure, please call and report this information to [place].    If any biopsies were taken you will be contacted by phone or by letter  within the next 1-3 weeks.  Please call us at [phone] if you have not heard about the biopsies in 3 weeks.    SIGNATURES/CONFIDENTIALITY: You and/or your care partner have signed paperwork which will be entered into your electronic medical record.  These signatures attest to the fact that that the information above on your After Visit Summary has been reviewed and is understood.  Full responsibility of the confidentiality of this discharge information lies with you and/or your care-partner.

## 2020-12-14 NOTE — Progress Notes (Signed)
approx 1 min after starting upper, pt brady down and laryngospasm.  EGD halted and jaw thrust given to break spasm.  After spasm break, pt coughing a lot and suction on scope and yaunker used to clean out oropharynx.  Secrtions were thick and many.  Pt sats were allowed to come back up to 100 and procedure restarted at 1427

## 2020-12-15 MED FILL — PARoxetine HCL 20 MG TABS: 20 | 30 days supply | Qty: 30 | Fill #2

## 2020-12-16 ENCOUNTER — Telehealth: Payer: Self-pay

## 2020-12-16 ENCOUNTER — Telehealth: Payer: Self-pay | Admitting: *Deleted

## 2020-12-16 NOTE — Telephone Encounter (Signed)
  Follow up Call-  Call back number 12/14/2020  Post procedure Call Back phone  # [phone] cell  Permission to leave phone message Yes  Some recent data might be hidden     Patient questions:  Do you have a fever, pain , or abdominal swelling? No. Pain Score  0 *  Have you tolerated food without any problems? Yes.    Have you been able to return to your normal activities? Yes.    Do you have any questions about your discharge instructions: Diet   No. Medications  No. Follow up visit  No.  Do you have questions or concerns about your Care? No.  Actions: * If pain score is 4 or above: No action needed, pain <4.   1. Have you developed a fever since your procedure? no  2.   Have you had an respiratory symptoms (SOB or cough) since your procedure? no  3.   Have you tested positive for COVID 19 since your procedure no  4.   Have you had any family members/close contacts diagnosed with the COVID 19 since your procedure?  no   If yes to any of these questions please route to Joylene John, RN and Joella Prince, RN

## 2020-12-16 NOTE — Telephone Encounter (Signed)
First post procedure follow up call, no answer 

## 2020-12-23 ENCOUNTER — Other Ambulatory Visit: Payer: Self-pay

## 2020-12-23 MED ORDER — MESALAMINE 1.2 G PO TBEC: 2.4000 g | DELAYED_RELEASE_TABLET | Freq: Every day | ORAL | 3 refills | Status: DC

## 2020-12-26 ENCOUNTER — Other Ambulatory Visit: Payer: Self-pay

## 2020-12-26 MED ORDER — MESALAMINE 1.2 G PO TBEC: 2.4000 g | DELAYED_RELEASE_TABLET | Freq: Every day | ORAL | 3 refills | Status: DC

## 2020-12-26 MED ORDER — PANTOPRAZOLE SODIUM 40 MG PO TBEC: 40.0000 mg | DELAYED_RELEASE_TABLET | Freq: Every day | ORAL | 3 refills | Status: DC

## 2020-12-26 MED FILL — LIALDA 1.2 GM TABLET SA: 1.2 | 30 days supply | Qty: 60 | Fill #0

## 2020-12-27 MED FILL — PARoxetine HCL 20 MG TABS: 20 | 30 days supply | Qty: 30 | Fill #0

## 2021-01-30 ENCOUNTER — Other Ambulatory Visit: Payer: Self-pay

## 2021-01-30 ENCOUNTER — Ambulatory Visit (INDEPENDENT_AMBULATORY_CARE_PROVIDER_SITE_OTHER): Payer: Medicaid Other | Admitting: Gastroenterology

## 2021-01-30 ENCOUNTER — Encounter: Payer: Self-pay | Admitting: Gastroenterology

## 2021-01-30 ENCOUNTER — Other Ambulatory Visit (HOSPITAL_COMMUNITY): Payer: Self-pay

## 2021-01-30 VITALS — BP 120/82 | HR 88 | Ht 65.0 in | Wt 217.0 lb

## 2021-01-30 DIAGNOSIS — K2990 Gastroduodenitis, unspecified, without bleeding: Secondary | ICD-10-CM

## 2021-01-30 DIAGNOSIS — K5730 Diverticulosis of large intestine without perforation or abscess without bleeding: Secondary | ICD-10-CM

## 2021-01-30 DIAGNOSIS — K209 Esophagitis, unspecified without bleeding: Secondary | ICD-10-CM

## 2021-01-30 DIAGNOSIS — K5900 Constipation, unspecified: Secondary | ICD-10-CM | POA: Diagnosis not present

## 2021-01-30 DIAGNOSIS — K2970 Gastritis, unspecified, without bleeding: Secondary | ICD-10-CM

## 2021-01-30 DIAGNOSIS — K5010 Crohn's disease of large intestine without complications: Secondary | ICD-10-CM

## 2021-01-30 MED ORDER — PANTOPRAZOLE SODIUM 40 MG PO TBEC
40.0000 mg | DELAYED_RELEASE_TABLET | Freq: Every morning | ORAL | 3 refills | Status: DC
Start: 2021-01-30 — End: 2022-11-14
  Filled 2021-01-30 – 2021-03-06 (×2): qty 90, 90d supply, fill #0

## 2021-01-30 MED ORDER — MESALAMINE 1.2 G PO TBEC
4.8000 g | DELAYED_RELEASE_TABLET | Freq: Every day | ORAL | 11 refills | Status: DC
  Filled 2021-01-30 – 2021-03-06 (×2): qty 120, 30d supply, fill #0

## 2021-01-30 MED ORDER — DICYCLOMINE HCL 20 MG PO TABS
20.0000 mg | ORAL_TABLET | Freq: Four times a day (QID) | ORAL | 3 refills | Status: DC | PRN
  Filled 2021-01-30 – 2021-03-06 (×2): qty 120, 30d supply, fill #0

## 2021-01-30 NOTE — Patient Instructions (Addendum)
It was a pleasure to see you today. Based on our discussion, I am providing you with my recommendations below:  RECOMMENDATION(S):    I am sending in refills today for Pantoprazole and Dicyclomine   I would like to increase your Lialda to 4.8mg  daily. I will send in a new prescription for this medication  PRESCRIPTION MEDICATION(S):   We have sent the following medication(s) to your pharmacy:  . Pantoprazole - please take 40mg  by mouth every morning . Dicyclomine - please take 20mg  by mouth 4 times daily as needed for abdominal cramping . Lialda - please take 4.8mg  by mouth every day  NOTE: If your medication(s) requires a PRIOR AUTHORIZATION, we will receive notification from your pharmacy. Once received, the process to submit for approval may take up to 7-10 business days. You will be contacted about any denials we have received from your insurance company as well as alternatives recommended by your provider.  How to Swallow a Pill - Swallow one pill at a time - Take a sip of water to pre-lubricate - Place medication on tongue - Take a sip of water - but don't swallow yet! - Tilt the chin down, then swallow - Repeat with remaining meds - Finish with glass of water  BMI:  . If you are age 35 or younger, your body mass index should be between 19-25. Your Body mass index is 36.11 kg/m. If this is out of the aformentioned range listed, please consider follow up with your Primary Care Provider.   Thank you for trusting me with your gastrointestinal care!    Thornton Park, MD, MPH

## 2021-01-30 NOTE — Progress Notes (Signed)
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  Segmental colitis associated with diverticulosis: Likely cause of LLQ/groin pain and epigastric abdominal pain with constipation. Reviewed diagnosis. Recommended fiber supplements, high fiber diet, and drinking at least 64 ounces of water daily. Increase Lialda to 4.8 grams daily. Continue to use dicyclomine PRN for abdominal pain and cramping.   Constipation with suspected pelvic floor dysfunction: High fiber diet. Discussed anorectal manometry +/- pelvic floor PT. Consider trial of Linzess.   Gastritis and reflux: Continue pantoprazole. Avoid NSAIDs.   PLAN: Add a daily dose of Citrucel or Benefiber given the history of diverticulosis/diverticulitis Avoid NSAIDs Continue Dicyclomine 20 mg QID PRN pain/cramping (refilled today) Continue pantoprazole 40mg  QAM Increase Lialda to 4.8 gram daily Referral to GYN given her symptoms, history of endometriosis, and family history Considering anorectal manometry Consider trial of Linzess 145 mcg daily Consider genetic counselor given the family history  Please see the "Patient Instructions" section for addition details about the plan.  HPI: Rebecca Watson is a 40 y.o. female who returns in follow-up for further evaluation of abdominal pain. Initially seen 05/24/20. Has endoscopic evaluation 12/14/20. Returns in follow-up. The interval history is obtained through the patient and review of her electronic  health record. She has anxiety and endometriosis but has not followed up with GYN. 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  Symptoms started in 2007 but she started seeking care in 2009 when she was hospitalized for both diverticulitis and endometriosis. Symptoms have occurred intermittently since that time. Experiences LLQ/groin pain and epigastric pain, hemorrhoids, and constipation. Lower abdominal pain and upper abdominal cramping occur concurrently. Feels like contractions. If she applies abdominal wall pressure it may resolve in 5-10 minutes.  One bowel movement a weeks with associated straining. Some blood and mucous. She is unsure if the constipation or abdominal pain is relieved by defecation. Constipation worsens the hemorrhoids with bleeding, itching, and pain.   Finds that certain foods trigger her symptoms: rice, beans, vegetables, greasy and spicy foods Tried MOM and Miralax, Miralax for a few days without change in symptoms. Seena causes more cramping.   She is concerned about cancer given her family.  Mother with ovarian cancer at age 88. Sister with ovarian cancer at an unknown age. Sister with polyps - but she doesn't know what kind. No known family history of colon cancer or polyps. No family history of uterine/endometrial cancer, pancreatic cancer or gastric/stomach cancer.  Recent evaluation of symptoms include: - Pelvic ultrasound 08/01/20: multiple uterine fibroids, simple appearing left paraovarian cysts - CT 10/31/20: diverticulosis from distal ileum to sigmoid colon; uterine fibroids with progression from 2017 - EGD 12/14/20: Gastritis, LA Class A reflux - Colonoscopy 12/14/20: pancolonic diverticulosis, focal active colitis in the sigmoid colon- likely segmental colitis associated with diverticulosis.   Returns in follow-up after starting Lialda 2.4 grams daily and pantorpazole 40gm QAM.    LLQ pain has improved from 8/10 to 3/10.  Now requires a  laxative weekly. Less  straining. Continues to require multiple small bowel movements to fully evacuate. She is able to sleep on her left side.  Finds that dairy may exacerbate her symptoms.   No new complaints or concerns.    Past Medical History:  Diagnosis Date  . Allergy   . Anemia    low FE per pt   . Constipation   . Diverticulitis   . Diverticulosis   . Dry eyes   . GERD (gastroesophageal reflux disease)   . Obesity   . Thyroid disease   . Trouble in sleeping     Past Surgical History:  Procedure Laterality Date  . CESAREAN SECTION     with twins  x 1   . IUD REMOVAL     broken IUD removed   . TUBAL LIGATION     x both     Current Outpatient Medications  Medication Sig Dispense Refill  . acetaminophen (TYLENOL) 500 MG tablet Take 1,000 mg by mouth every 6 (six) hours as needed for moderate pain or headache.    . bisacodyl (DULCOLAX) 5 MG EC tablet TAKE 1 TABLET (5 MG TOTAL) BY MOUTH ONCE FOR 1 DOSE. LAXATIVE 5 MG TABS FOR COLON PREP 4 tablet 0  . cetirizine (ZYRTEC) 10 MG chewable tablet Chew 10 mg by mouth daily.    . cyclobenzaprine (FLEXERIL) 5 MG tablet Take 1 tablet (5 mg total) by mouth 3 (three) times daily as needed for muscle spasms. 30 tablet 1  . diclofenac Sodium (VOLTAREN) 1 % GEL Apply topically 4 (four) times daily.    . hydrOXYzine (ATARAX/VISTARIL) 50 MG tablet Take 1 tablet (50 mg total) by mouth 3 (three) times daily as needed for anxiety. 90 tablet 1  . mesalamine (LIALDA) 1.2 g EC tablet Take 4 tablets (4.8 g total) by mouth daily with breakfast. 120 tablet 11  . pantoprazole (PROTONIX) 40 MG tablet Take 1 tablet (40 mg total) by mouth every morning. 90 tablet 3  . PARoxetine (PAXIL) 20 MG tablet TAKE 1 TABLET (20 MG TOTAL) BY MOUTH DAILY. 30 tablet 2  . dicyclomine (BENTYL) 20 MG tablet Take 1 tablet (20 mg total) by mouth 4 (four) times daily as needed. 120 tablet 3   No current facility-administered medications for this visit.    Allergies  as of 01/30/2021 - Review Complete 01/30/2021  Allergen Reaction Noted  . Flagyl [metronidazole] Nausea And Vomiting 10/03/2016    Family History  Problem Relation Age of Onset  . Bone cancer Mother        deceased age 23   . Colon cancer Neg Hx   . Esophageal cancer Neg Hx   . Colon polyps Neg Hx   . Rectal cancer Neg Hx   . Stomach cancer Neg Hx      Physical Exam: General:   Alert,  well-nourished, pleasant and cooperative in NAD Head:  Normocephalic and atraumatic. Eyes:  Sclera clear, no icterus.   Conjunctiva pink. Abdomen:  Soft, nontender, nondistended, normal bowel sounds, no rebound or guarding. No hepatosplenomegaly.  I am unable to reproduce her symptoms.  Neurologic:  Alert and  oriented x4;  grossly nonfocal Skin:  Intact without significant lesions or rashes. Psych:  Alert and cooperative. Normal mood and affect.    Paisleigh Maroney L. Tarri Glenn, MD, MPH 02/05/2021, 8:29 PM

## 2021-01-31 ENCOUNTER — Other Ambulatory Visit (HOSPITAL_COMMUNITY): Payer: Self-pay

## 2021-02-05 ENCOUNTER — Encounter: Payer: Self-pay | Admitting: Gastroenterology

## 2021-02-08 ENCOUNTER — Other Ambulatory Visit (HOSPITAL_COMMUNITY): Payer: Self-pay

## 2021-03-06 ENCOUNTER — Other Ambulatory Visit (HOSPITAL_COMMUNITY): Payer: Self-pay

## 2021-03-06 ENCOUNTER — Other Ambulatory Visit: Payer: Self-pay | Admitting: Family Medicine

## 2021-03-06 DIAGNOSIS — T148XXA Other injury of unspecified body region, initial encounter: Secondary | ICD-10-CM

## 2021-03-06 DIAGNOSIS — F32A Depression, unspecified: Secondary | ICD-10-CM

## 2021-03-06 MED FILL — Paroxetine HCl Tab 20 MG: ORAL | 30 days supply | Qty: 30 | Fill #0 | Status: CN

## 2021-03-07 ENCOUNTER — Other Ambulatory Visit: Payer: Self-pay

## 2021-03-08 ENCOUNTER — Ambulatory Visit: Payer: Medicaid Other | Admitting: Family Medicine

## 2021-03-08 ENCOUNTER — Encounter: Payer: Self-pay | Admitting: Family Medicine

## 2021-03-08 ENCOUNTER — Other Ambulatory Visit (HOSPITAL_COMMUNITY): Payer: Self-pay

## 2021-03-08 VITALS — BP 128/70 | HR 74 | Temp 98.3°F | Wt 214.4 lb

## 2021-03-08 DIAGNOSIS — F419 Anxiety disorder, unspecified: Secondary | ICD-10-CM | POA: Diagnosis not present

## 2021-03-08 DIAGNOSIS — L84 Corns and callosities: Secondary | ICD-10-CM | POA: Diagnosis not present

## 2021-03-08 DIAGNOSIS — F32A Depression, unspecified: Secondary | ICD-10-CM | POA: Diagnosis not present

## 2021-03-08 DIAGNOSIS — N939 Abnormal uterine and vaginal bleeding, unspecified: Secondary | ICD-10-CM | POA: Diagnosis not present

## 2021-03-08 LAB — BASIC METABOLIC PANEL
BUN: 7 mg/dL (ref 6–23)
CO2: 25 mEq/L (ref 19–32)
Calcium: 9.2 mg/dL (ref 8.4–10.5)
Chloride: 105 mEq/L (ref 96–112)
Creatinine, Ser: 0.77 mg/dL (ref 0.40–1.20)
GFR: 96.42 mL/min (ref >60.00)
Glucose, Bld: 62 mg/dL — ABNORMAL LOW (ref 70–99)
Potassium: 3.9 mEq/L (ref 3.5–5.1)
Sodium: 139 mEq/L (ref 135–145)

## 2021-03-08 LAB — CBC WITH DIFFERENTIAL/PLATELET
Basophils Absolute: 0.1 10*3/uL (ref 0.0–0.1)
Basophils Relative: 0.9 % (ref 0.0–3.0)
Eosinophils Absolute: 0.1 10*3/uL (ref 0.0–0.7)
Eosinophils Relative: 1.7 % (ref 0.0–5.0)
HCT: 38.4 % (ref 36.0–46.0)
Hemoglobin: 12.3 g/dL (ref 12.0–15.0)
Lymphocytes Relative: 39.7 % (ref 12.0–46.0)
Lymphs Abs: 2.4 10*3/uL (ref 0.7–4.0)
MCHC: 32.1 g/dL (ref 30.0–36.0)
MCV: 77.2 fl — ABNORMAL LOW (ref 78.0–100.0)
Monocytes Absolute: 0.6 10*3/uL (ref 0.1–1.0)
Monocytes Relative: 9.3 % (ref 3.0–12.0)
Neutro Abs: 3 10*3/uL (ref 1.4–7.7)
Neutrophils Relative %: 48.4 % (ref 43.0–77.0)
Platelets: 256 10*3/uL (ref 150.0–400.0)
RBC: 4.98 Mil/uL (ref 3.87–5.11)
RDW: 18.8 % — ABNORMAL HIGH (ref 11.5–15.5)
WBC: 6.1 10*3/uL (ref 4.0–10.5)

## 2021-03-08 MED ORDER — PAROXETINE HCL 20 MG PO TABS
20.0000 mg | ORAL_TABLET | Freq: Every day | ORAL | 0 refills | Status: DC
  Filled 2021-03-08: qty 30, 30d supply, fill #0
  Filled 2021-03-08: qty 30, fill #0

## 2021-03-08 NOTE — Progress Notes (Signed)
Subjective:    Patient ID: Rebecca Watson, female    DOB: [birth year], 40 y.o.   MRN: [id number]  Chief Complaint  Patient presents with  . Medication Refill    Needs a refill on all medications, wants lab work done  . Foot Injury    Has place on bottom of rt foot. States it has been shaved before, but now is having problems walking it hurts, would like a referral to ortho    HPI Patient is a 40 yo female with pmh sig for Allergies, GERD, obesity, h/o anemia, diverticulitis, anxiety, depression, was seen today for follow up and several concerns.  Pt frustrated her refill request were denied.  Out of Paxil x 1 month.  Pt previously advised to f/u for refill on Paxil.  Pt also requested a refill on Flexeril.  Pt advised med declined as it was for acute issue in 2019.  Conversation escalated.  Pt stated she had a list of issues with this provider, she was not sure why she comes to visits, and she already knows she will be asked about counseling.  In reference to PHQ-9 questionnaire answered at previous visit, patient states she never said she had difficulty sleeping 3 times out of the week.  Pt did not understand why her visit in January was marked in a way that made it seem like she did not show up.  Pt advised that if she joined the e-visit, then re-joined at some point it may have stated she left the visit.  Given pt's argumentive tone during the entire visit, this provider agreed that pt may benefit from seeing another provider and attempted to leave the room.  Pt responded by saying "Oh, you just gonna leave"?  "You don't like being questioned"?  "What, I can't ask questions"?  Pt advised this provider welcomed questions.    Pt stated she was still waiting on a referral for her foot despite numerous mychart messages about it.  Pt advised that per chart review there are no mychart messages, phone calls, or visit notes regarding foot pain.  Pt was unable to locate messages on her mychart app.  Pt  states she had an area on the bottom of her R foot that was pared down but is causing discomfort.  Pt states the area was due to plantar fasciitis yrs ago, but "dug out" and has caused a hard spot.  At the end of visit pt states, "So what about my labs"?   Pt wants labs as she is still having AUB.  Followed by OB/Gyn.  Past Medical History:  Diagnosis Date  . Allergy   . Anemia    low FE per pt   . Constipation   . Diverticulitis   . Diverticulosis   . Dry eyes   . GERD (gastroesophageal reflux disease)   . Obesity   . Thyroid disease   . Trouble in sleeping     Allergies  Allergen Reactions  . Flagyl [Metronidazole] Nausea And Vomiting    Vomiting     ROS General: Denies fever, chills, night sweats, changes in weight, changes in appetite HEENT: Denies headaches, ear pain, changes in vision, rhinorrhea, sore throat CV: Denies CP, palpitations, SOB, orthopnea Pulm: Denies SOB, cough, wheezing GI: Denies abdominal pain, nausea, vomiting, diarrhea, constipation GU: Denies dysuria, hematuria, frequency, vaginal discharge +AUB Msk: Denies muscle cramps, joint pains  +R foot pain Neuro: Denies weakness, numbness, tingling   Skin: Denies rashes, bruising  +hard area on  bottom of R foot Psych: Denies depression, anxiety, hallucinations     Objective:    Blood pressure 128/70, pulse 74, temperature 98.3 F (36.8 C), temperature source Oral, weight 214 lb 6.4 oz (97.3 kg), SpO2 98 %.  Gen. Argumentative, well-nourished, in no distress, elevated affect HEENT: [place]/AT, face symmetric, conjunctiva clear, no scleral icterus, EOMI nares patent without drainage Lungs: no accessory muscle use Cardiovascular: RRR, no peripheral edema Musculoskeletal: No deformities, no cyanosis or clubbing, normal tone Neuro:  A&Ox3, CN II-XII intact, normal gait Skin:  Warm, no rash.  Plantar surface of R foot with a dime-sized circular area of callus with white appearing blood in center and  hyperpigmentation surrounding lesion.   Wt Readings from Last 3 Encounters:  03/08/21 214 lb 6.4 oz (97.3 kg)  01/30/21 217 lb (98.4 kg)  12/14/20 212 lb (96.2 kg)    Lab Results  Component Value Date   WBC 6.2 03/17/2020   HGB 11.0 (L) 03/17/2020   HCT 34.5 (L) 03/17/2020   PLT 293.0 03/17/2020   GLUCOSE 78 03/17/2020   CHOL 193 10/15/2019   TRIG 55.0 10/15/2019   HDL 70.80 10/15/2019   LDLCALC 112 (H) 10/15/2019   ALT 7 03/17/2020   AST 12 03/17/2020   NA 138 03/17/2020   K 4.0 03/17/2020   CL 105 03/17/2020   CREATININE 0.69 03/17/2020   BUN 10 03/17/2020   CO2 25 03/17/2020   TSH 0.49 03/17/2020   HGBA1C 5.1 10/15/2019    Assessment/Plan:  Abnormal uterine bleeding (AUB)  -advised to f/u with OB/Gyn - Plan: CBC with Differential/Platelet, Basic metabolic panel  Plantar callus  - Plan: Ambulatory referral to Podiatry  Anxiety and depression  - Plan: PARoxetine (PAXIL) 20 MG tablet  Given the strain in the pt-provider relationship, pt advised it would be beneficial for her to find a new PCP moving forward.  Patient advised she would be given a 30-day supply of her medications provided by this provider.  Labs and referral placed.  Grier Mitts, MD

## 2021-03-14 ENCOUNTER — Ambulatory Visit: Payer: Medicaid Other | Admitting: Family Medicine

## 2021-03-15 ENCOUNTER — Ambulatory Visit: Payer: Medicaid Other | Admitting: Family Medicine

## 2021-03-20 ENCOUNTER — Ambulatory Visit: Payer: Medicaid Other | Admitting: Podiatry

## 2021-03-20 ENCOUNTER — Other Ambulatory Visit: Payer: Self-pay

## 2021-03-20 ENCOUNTER — Other Ambulatory Visit (HOSPITAL_COMMUNITY): Payer: Self-pay

## 2021-03-20 ENCOUNTER — Ambulatory Visit (INDEPENDENT_AMBULATORY_CARE_PROVIDER_SITE_OTHER): Payer: Medicaid Other

## 2021-03-20 DIAGNOSIS — L84 Corns and callosities: Secondary | ICD-10-CM

## 2021-03-20 DIAGNOSIS — D492 Neoplasm of unspecified behavior of bone, soft tissue, and skin: Secondary | ICD-10-CM

## 2021-03-20 DIAGNOSIS — M79671 Pain in right foot: Secondary | ICD-10-CM

## 2021-03-20 DIAGNOSIS — L0889 Other specified local infections of the skin and subcutaneous tissue: Secondary | ICD-10-CM

## 2021-03-20 DIAGNOSIS — L989 Disorder of the skin and subcutaneous tissue, unspecified: Secondary | ICD-10-CM

## 2021-03-20 MED ORDER — SALICYLIC ACID 6 % EX CREA
1.0000 "application " | TOPICAL_CREAM | Freq: Every day | CUTANEOUS | 0 refills | Status: DC
  Filled 2021-03-20: qty 227, fill #0

## 2021-03-20 NOTE — Patient Instructions (Signed)
Keep the bandage on for 24 hours. At that time, remove and clean with soap and water. If it hurts or burns before 24 hours go ahead and remove the bandage and wash with soap and water. Keep the area clean. If there is any blistering cover with antibiotic ointment and a bandage. Monitor for any redness, drainage, or other signs of infection. Call the office if any are to occur. If you have any questions, please call the office at [phone].  

## 2021-03-26 NOTE — Progress Notes (Signed)
Subjective:   Patient ID: Rebecca Watson, female   DOB: 40 y.o.   MRN: [id number]   HPI 40 year old female presents the office today for concerns of thick painful callus on the bottom of her right foot and medial foot.  She is tried soaking her foot without any significant improvement.  She does wear compression socks regular.  Denies any recent injury or trauma denies stepping any foreign objects.   Review of Systems  All other systems reviewed and are negative.  Past Medical History:  Diagnosis Date  . Allergy   . Anemia    low FE per pt   . Constipation   . Diverticulitis   . Diverticulosis   . Dry eyes   . GERD (gastroesophageal reflux disease)   . Obesity   . Thyroid disease   . Trouble in sleeping     Past Surgical History:  Procedure Laterality Date  . CESAREAN SECTION     with twins  x 1   . IUD REMOVAL     broken IUD removed   . TUBAL LIGATION     x both      Current Outpatient Medications:  .  Salicylic Acid 6 % CREA, Apply 1 application topically daily., Disp: 227 g, Rfl: 0 .  acetaminophen (TYLENOL) 500 MG tablet, Take 1,000 mg by mouth every 6 (six) hours as needed for moderate pain or headache., Disp: , Rfl:  .  bisacodyl (DULCOLAX) 5 MG EC tablet, TAKE 1 TABLET (5 MG TOTAL) BY MOUTH ONCE FOR 1 DOSE. LAXATIVE 5 MG TABS FOR COLON PREP, Disp: 4 tablet, Rfl: 0 .  cetirizine (ZYRTEC) 10 MG chewable tablet, Chew 10 mg by mouth daily., Disp: , Rfl:  .  cyclobenzaprine (FLEXERIL) 5 MG tablet, Take 1 tablet (5 mg total) by mouth 3 (three) times daily as needed for muscle spasms., Disp: 30 tablet, Rfl: 1 .  diclofenac Sodium (VOLTAREN) 1 % GEL, Apply topically 4 (four) times daily., Disp: , Rfl:  .  dicyclomine (BENTYL) 20 MG tablet, Take 1 tablet (20 mg total) by mouth 4 (four) times daily as needed., Disp: 120 tablet, Rfl: 3 .  hydrOXYzine (ATARAX/VISTARIL) 50 MG tablet, Take 1 tablet (50 mg total) by mouth 3 (three) times daily as needed for anxiety., Disp: 90  tablet, Rfl: 1 .  mesalamine (LIALDA) 1.2 g EC tablet, Take 4 tablets (4.8 g total) by mouth daily with breakfast., Disp: 120 tablet, Rfl: 11 .  pantoprazole (PROTONIX) 40 MG tablet, Take 1 tablet (40 mg total) by mouth every morning., Disp: 90 tablet, Rfl: 3 .  PARoxetine (PAXIL) 20 MG tablet, Take 1 tablet (20 mg total) by mouth daily., Disp: 30 tablet, Rfl: 0  Allergies  Allergen Reactions  . Flagyl [Metronidazole] Nausea And Vomiting    Vomiting         Objective:  Physical Exam  General: AAO x3, NAD  Dermatological: Punctate annular hyperkeratotic lesion plantar aspect of right midfoot.  There is no  erythema, drainage or pus or any signs of infection.  No open lesions today.  There is no evidence of puncture wound, foreign body.  Vascular: Dorsalis Pedis artery and Posterior Tibial artery pedal pulses are 2/4 bilateral with immedate capillary fill time.There is no pain with calf compression, swelling, warmth, erythema.   Neruologic: Grossly intact via light touch bilateral.   Musculoskeletal: Tenderness to hyperkeratotic lesion but no other areas of discomfort.  Muscular strength 5/5 in all groups tested bilateral.  Gait: Unassisted,  Nonantalgic.       Assessment:   Right foot benign skin lesion     Plan:  -Treatment options discussed including all alternatives, risks, and complications -Etiology of symptoms were discussed -Patient was sharply debrided with without any complications or bleeding.  Skin was Alcohol and a pad was placed followed by salicylic acid and a bandage.  Post procedure instructions discussed.  Monitor for any signs or symptoms of infection  Trula Slade DPM

## 2021-03-28 ENCOUNTER — Other Ambulatory Visit: Payer: Self-pay | Admitting: Podiatry

## 2021-03-28 DIAGNOSIS — L989 Disorder of the skin and subcutaneous tissue, unspecified: Secondary | ICD-10-CM

## 2021-04-07 ENCOUNTER — Encounter: Payer: Self-pay | Admitting: Family Medicine

## 2021-04-12 IMAGING — CT CT ABD-PELV W/ CM
2 of 4 series · 17 of 46 positions shown, 19 images · IV contrast (OMNIPAQUE)
Comparison: 10/04/2016

CLINICAL DATA: Abdominal pain and diverticulosis.

EXAM:
CT ABDOMEN AND PELVIS WITH CONTRAST
TECHNIQUE: Multidetector CT imaging of the abdomen and pelvis was performed
using the standard protocol following bolus administration of
intravenous contrast.
CONTRAST:  100mL OMNIPAQUE IOHEXOL 300 MG/ML  SOLN

[Series 2: axial st · axial · 0.75mm/px · z∈[+904,+1299]mm · 14 of 91 slices shown, 16 images]
[im 6/91  soft-tissue]
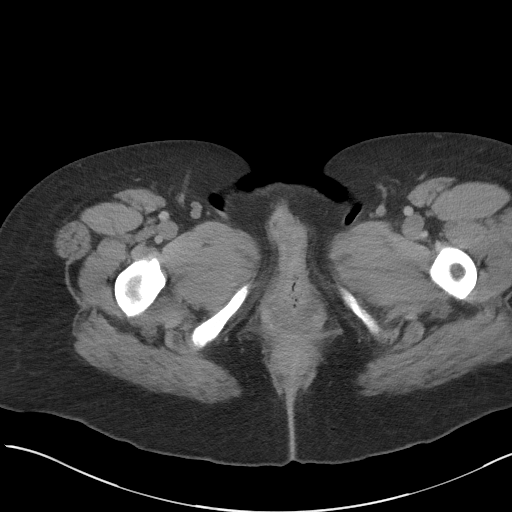
[im 6/91  bone]
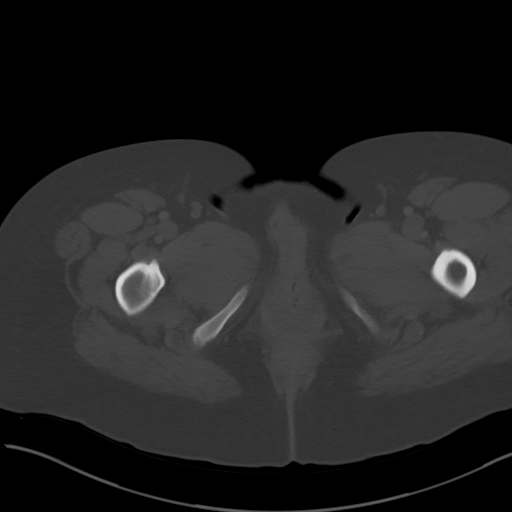
[im 11/91  soft-tissue]
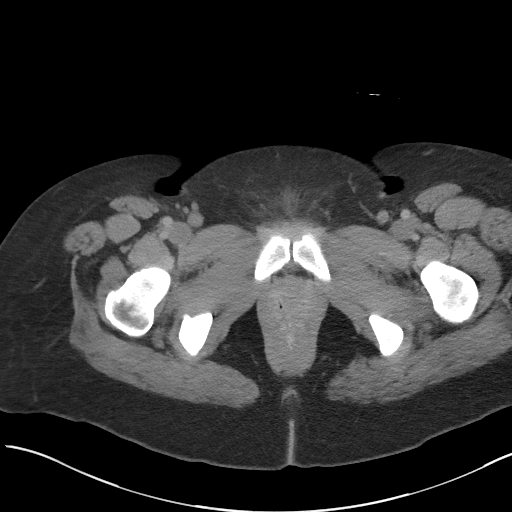
[im 16/91  soft-tissue]
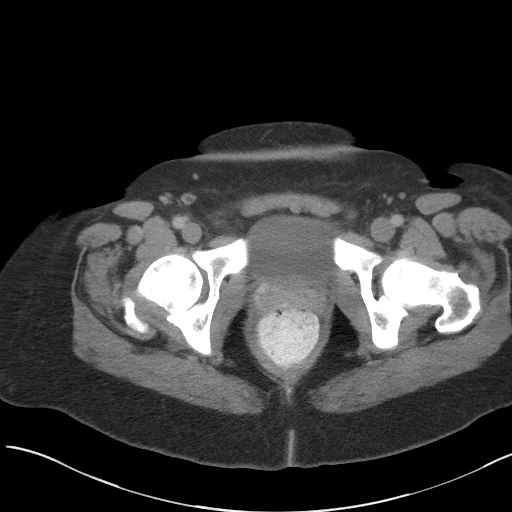
[im 27/91  soft-tissue]
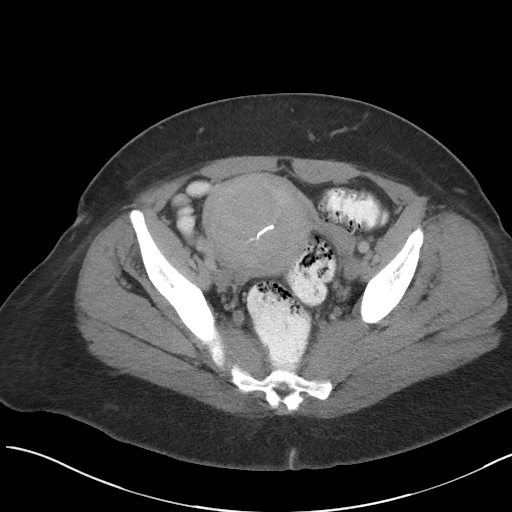
[im 32/91  soft-tissue]
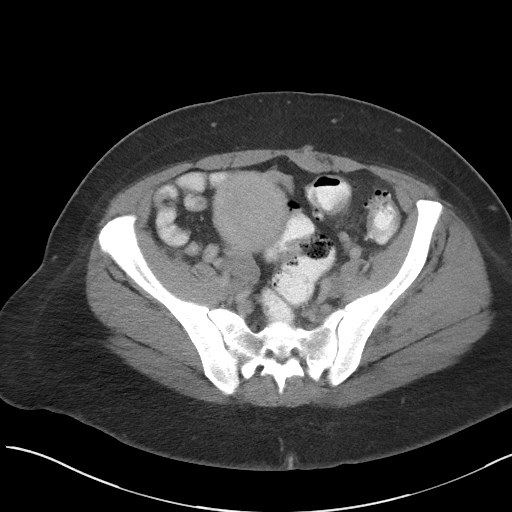
[im 38/91  soft-tissue]
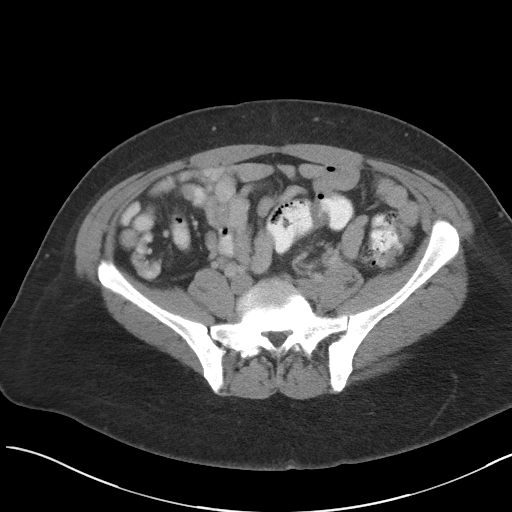
[im 43/91  soft-tissue]
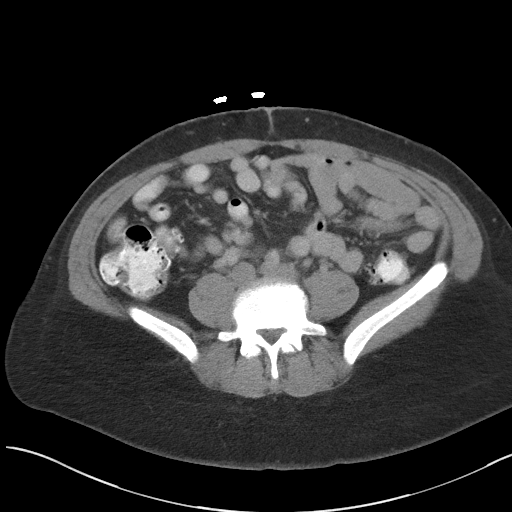
[im 48/91  soft-tissue]
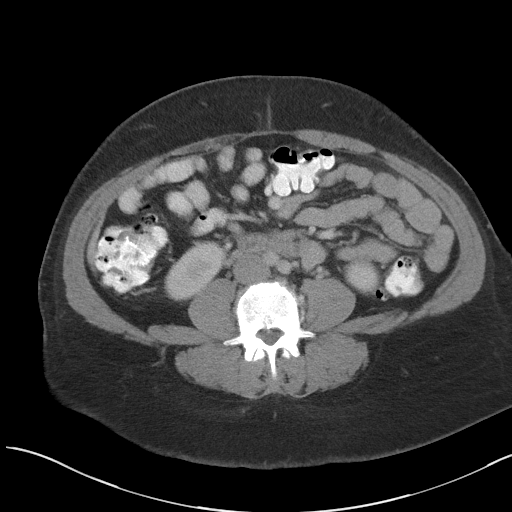
[im 53/91  soft-tissue]
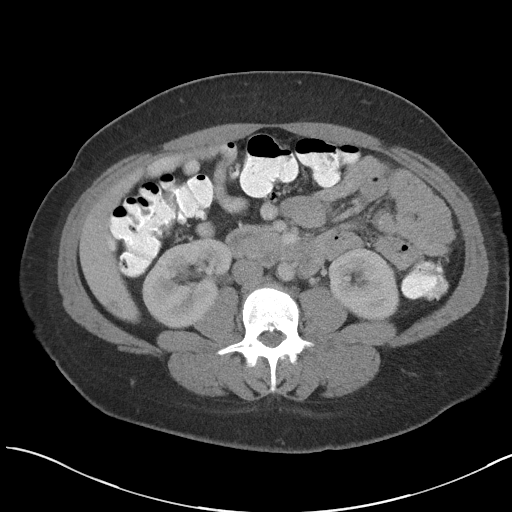
[im 53/91  bone]
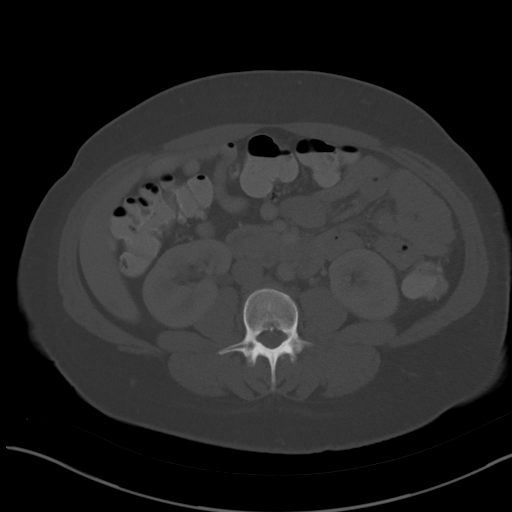
[im 59/91  soft-tissue]
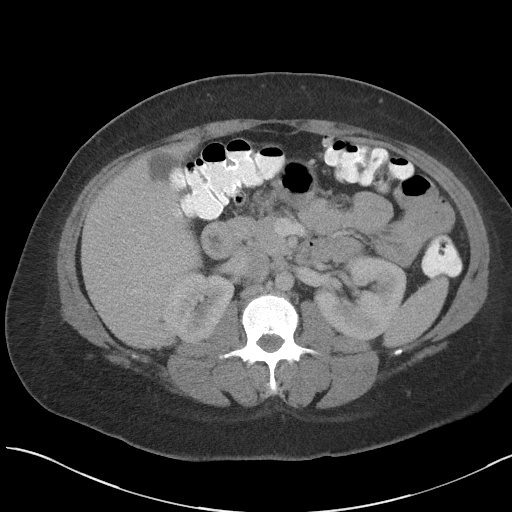
[im 69/91  soft-tissue]
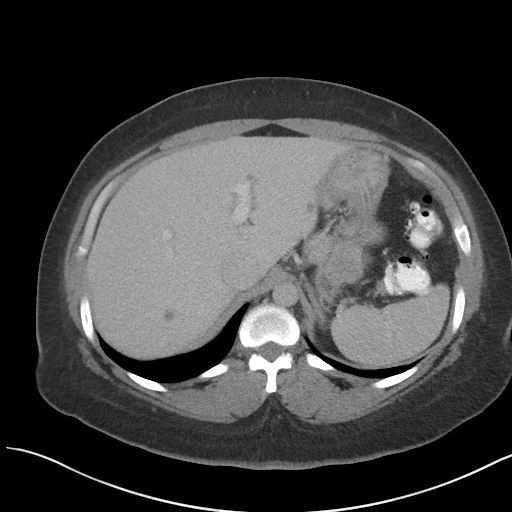
[im 75/91  soft-tissue]
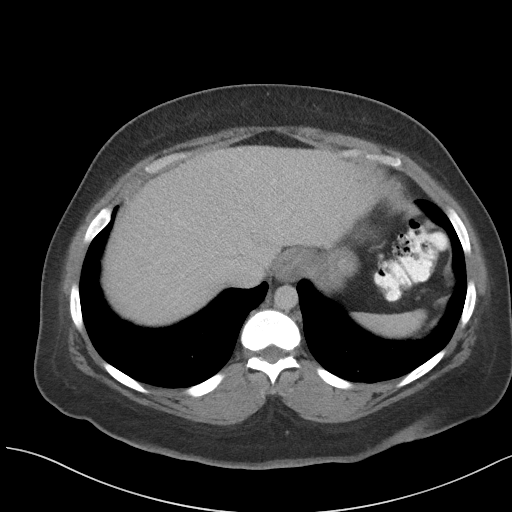
[im 80/91  soft-tissue]
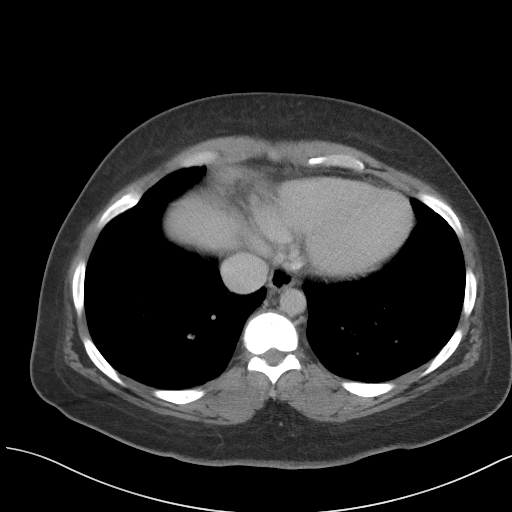
[im 85/91  soft-tissue]
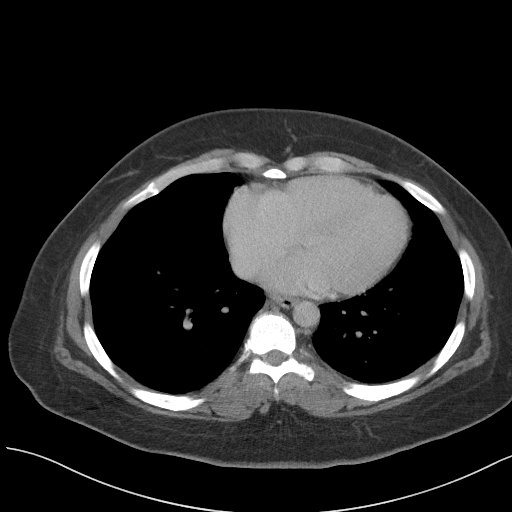

[Series 4: coronal st · coronal · 0.84mm/px · 3 of 81 slices shown]
[im 27/81  soft-tissue]
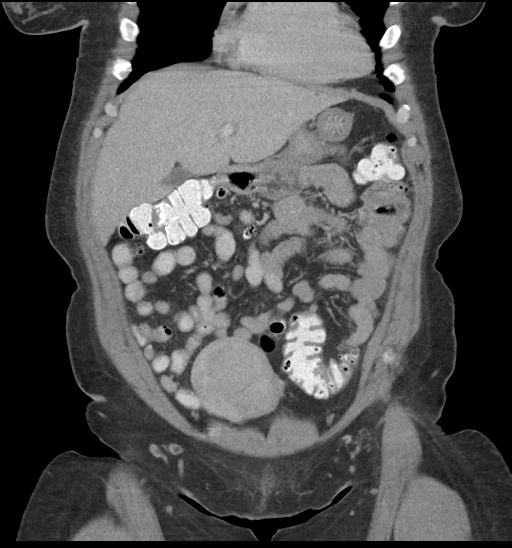
[im 36/81  soft-tissue]
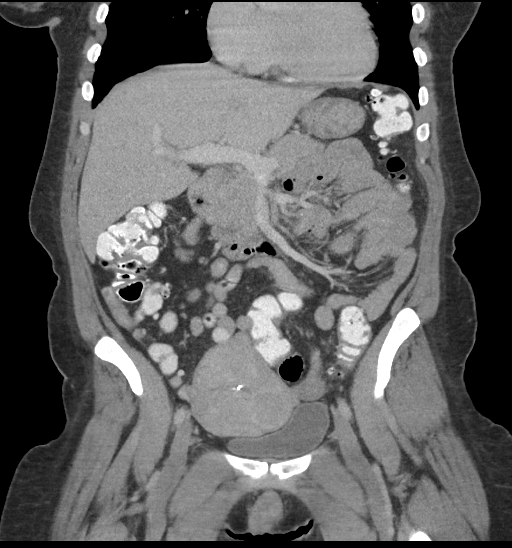
[im 45/81  soft-tissue]
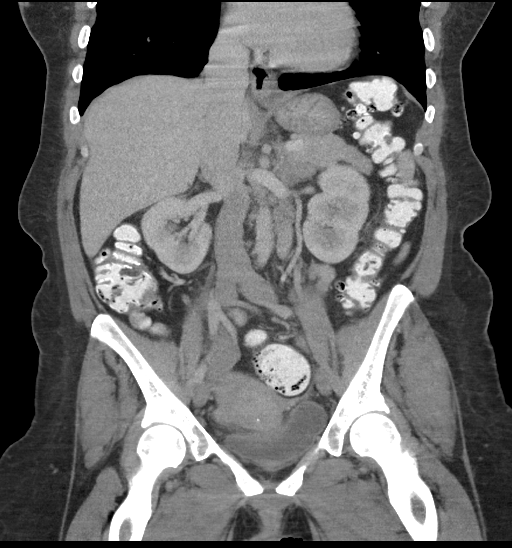

[17 of 46 positions shown; findings below may reference images not displayed]

FINDINGS: Lower chest:  No contributory findings.

Hepatobiliary: Multiple small hepatic cysts.No evidence of biliary
obstruction or stone.

Pancreas: Unremarkable.

Spleen: Unremarkable.

Adrenals/Urinary Tract: Negative adrenals. No hydronephrosis or
stone. Unremarkable bladder.

Stomach/Bowel: Diverticulosis of the distal ileum and diffusely
involving the colon. The number of diverticula are innumerable. No
acute inflammation.

Vascular/Lymphatic: No acute vascular abnormality. No mass or
adenopathy.

Reproductive:Multiple uterine masses which have developed or
progressed since prior, most notably a 4.4 cm fundal mass which
depresses an IUD. Tubal ligation clips seen in the right lower
quadrant.

Other: No ascites or pneumoperitoneum.

Musculoskeletal: No acute abnormalities. L3-4 disc narrowing and
bulging.
IMPRESSION: 1. Diverticulosis from the distal ileum to the sigmoid colon. No
acute inflammation.
2. Numerous uterine fibroids with progression from 7578.

## 2021-09-14 ENCOUNTER — Other Ambulatory Visit (HOSPITAL_COMMUNITY): Payer: Self-pay

## 2022-01-08 ENCOUNTER — Ambulatory Visit (INDEPENDENT_AMBULATORY_CARE_PROVIDER_SITE_OTHER): Payer: Medicaid Other | Admitting: Obstetrics & Gynecology

## 2022-01-08 ENCOUNTER — Other Ambulatory Visit: Payer: Self-pay

## 2022-01-08 ENCOUNTER — Encounter: Payer: Self-pay | Admitting: Obstetrics & Gynecology

## 2022-01-08 VITALS — BP 142/73 | HR 91 | Wt 211.9 lb

## 2022-01-08 DIAGNOSIS — D259 Leiomyoma of uterus, unspecified: Secondary | ICD-10-CM

## 2022-01-08 DIAGNOSIS — Z975 Presence of (intrauterine) contraceptive device: Secondary | ICD-10-CM

## 2022-01-08 DIAGNOSIS — Z30432 Encounter for removal of intrauterine contraceptive device: Secondary | ICD-10-CM | POA: Diagnosis not present

## 2022-01-08 NOTE — Progress Notes (Signed)
Patient ID: Rebecca Watson, female   DOB: 04/29/1981, 41 y.o.   MRN: [id number] ? ?Chief Complaint  ?Patient presents with  ? IUD removal  ? ? ?HPI ?Rebecca Watson is a 41 y.o. female.  She has frequent irregular vaginal bleeding for several months with Liletta since 08/2020. S/p BTL. She requests IUD  removal ?HPI ? ?Past Medical History:  ?Diagnosis Date  ? Allergy   ? Anemia   ? low FE per pt   ? Constipation   ? Diverticulitis   ? Diverticulosis   ? Dry eyes   ? GERD (gastroesophageal reflux disease)   ? Obesity   ? Thyroid disease   ? Trouble in sleeping   ? ? ?Past Surgical History:  ?Procedure Laterality Date  ? CESAREAN SECTION    ? with twins  x 1   ? IUD REMOVAL    ? broken IUD removed   ? TUBAL LIGATION    ? x both   ? ? ?Family History  ?Problem Relation Age of Onset  ? Bone cancer Mother   ?     deceased age 11   ? Colon cancer Neg Hx   ? Esophageal cancer Neg Hx   ? Colon polyps Neg Hx   ? Rectal cancer Neg Hx   ? Stomach cancer Neg Hx   ? ? ?Social History ?Social History  ? ?Tobacco Use  ? Smoking status: Every Day  ?  Packs/day: 0.50  ?  Types: Cigarettes  ? Smokeless tobacco: Never  ?Vaping Use  ? Vaping Use: Never used  ?Substance Use Topics  ? Alcohol use: Yes  ?  Comment: ocassionally maybe 1  ? Drug use: Yes  ?  Types: Marijuana  ?  Comment: weaning off - smokes some not daily. Smoke marijuana this am. 12-14-20  ? ? ?Allergies  ?Allergen Reactions  ? Flagyl [Metronidazole] Nausea And Vomiting  ?  Vomiting ?  ? ? ?Current Outpatient Medications  ?Medication Sig Dispense Refill  ? acetaminophen (TYLENOL) 500 MG tablet Take 1,000 mg by mouth every 6 (six) hours as needed for moderate pain or headache.    ? cetirizine (ZYRTEC) 10 MG chewable tablet Chew 10 mg by mouth daily.    ? cyclobenzaprine (FLEXERIL) 5 MG tablet Take 1 tablet (5 mg total) by mouth 3 (three) times daily as needed for muscle spasms. (Patient not taking: Reported on 01/08/2022) 30 tablet 1  ? diclofenac Sodium (VOLTAREN) 1 %  GEL Apply topically 4 (four) times daily. (Patient not taking: Reported on 01/08/2022)    ? dicyclomine (BENTYL) 20 MG tablet Take 1 tablet (20 mg total) by mouth 4 (four) times daily as needed. (Patient not taking: Reported on 01/08/2022) 120 tablet 3  ? hydrOXYzine (ATARAX/VISTARIL) 50 MG tablet Take 1 tablet (50 mg total) by mouth 3 (three) times daily as needed for anxiety. (Patient not taking: Reported on 01/08/2022) 90 tablet 1  ? mesalamine (LIALDA) 1.2 g EC tablet Take 4 tablets (4.8 g total) by mouth daily with breakfast. (Patient not taking: Reported on 01/08/2022) 120 tablet 11  ? pantoprazole (PROTONIX) 40 MG tablet Take 1 tablet (40 mg total) by mouth every morning. (Patient not taking: Reported on 01/08/2022) 90 tablet 3  ? PARoxetine (PAXIL) 20 MG tablet Take 1 tablet (20 mg total) by mouth daily. (Patient not taking: Reported on 01/08/2022) 30 tablet 0  ? Salicylic Acid 6 % CREA Apply 1 application topically daily. (Patient not taking: Reported on 01/08/2022)  227 g 0  ? ?No current facility-administered medications for this visit.  ? ? ?Review of Systems ?Review of Systems  ?Constitutional: Negative.   ?Respiratory: Negative.    ?Gastrointestinal: Negative.   ?Genitourinary:  Positive for menstrual problem and vaginal bleeding. Negative for pelvic pain.  ? ?Blood pressure (!) 142/73, pulse 91, weight 211 lb 14.4 oz (96.1 kg). ? ?Physical Exam ?Physical Exam ?Vitals and nursing note reviewed. Exam conducted with a chaperone present.  ?Constitutional:   ?   Appearance: Normal appearance.  ?Genitourinary: ?   General: Normal vulva.  ?   Exam position: Lithotomy position.  ?   Cervix: Normal.  ?   Uterus: Enlarged (12 week).   ?   Adnexa: Right adnexa normal and left adnexa normal.  ?   Comments: String 2cm . Time out and removal with ring forceps intact Liletta IUD. ?Skin: ?   General: Skin is warm and dry.  ?Neurological:  ?   Mental Status: She is alert.  ?Psychiatric:     ?   Mood and Affect: Mood normal.      ?   Behavior: Behavior normal.  ? ? ?Data Reviewed ?Pelvic ultrasound 07/2020 ? ?Assessment ?IUD (intrauterine device) in place ? ?Uterine leiomyoma, unspecified location - Plan: US PELVIC COMPLETE WITH TRANSVAGINAL ? ? ?Plan ?IUD removed. F/u US for surveillance of fibroids and RTC to discuss management ? ? ? ?Emeterio Reeve ?01/08/2022, 2:12 PM ? ? ? ?

## 2022-01-18 ENCOUNTER — Ambulatory Visit
Admission: RE | Admit: 2022-01-18 | Discharge: 2022-01-18 | Disposition: A | Discharge status: Home / Self Care | Payer: Medicaid Other | Source: Ambulatory Visit | Attending: Obstetrics & Gynecology | Admitting: Obstetrics & Gynecology

## 2022-01-18 ENCOUNTER — Other Ambulatory Visit: Payer: Self-pay

## 2022-01-18 DIAGNOSIS — D259 Leiomyoma of uterus, unspecified: Secondary | ICD-10-CM | POA: Diagnosis not present

## 2022-06-30 IMAGING — US US PELVIS COMPLETE WITH TRANSVAGINAL
1 series · 14 of 25 positions shown · non-contrast
Comparison: 08/01/2020
COMPARISON: 08/01/2020

Addendum:
CLINICAL DATA: Uterine fibroids

EXAM:
TRANSABDOMINAL AND TRANSVAGINAL ULTRASOUND OF PELVIS
DOPPLER ULTRASOUND OF OVARIES
TECHNIQUE: Both transabdominal and transvaginal ultrasound examinations of the
pelvis were performed. Transabdominal technique was performed for
global imaging of the pelvis including uterus, ovaries, adnexal
regions, and pelvic cul-de-sac.
It was necessary to proceed with endovaginal exam following the
transabdominal exam to visualize the endometrium and ovaries. Color
and duplex Doppler ultrasound was utilized to evaluate blood flow to
the ovaries.

[Series 1: us pelvis complete with transvaginal · 14 of 120 slices shown]
[im 1/120]
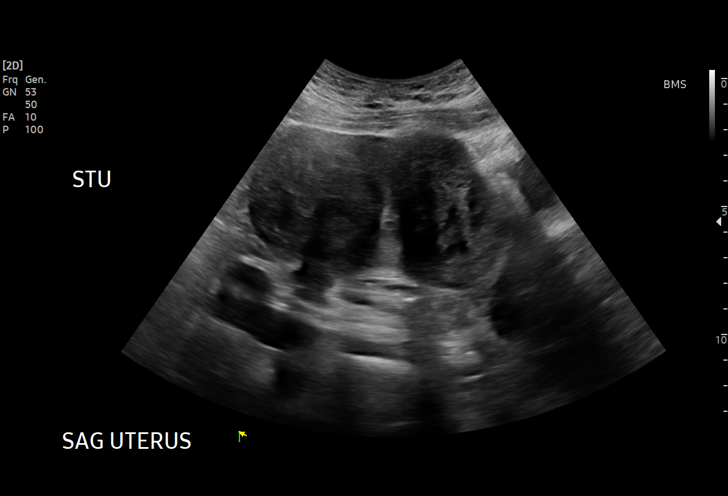
[im 10/120]
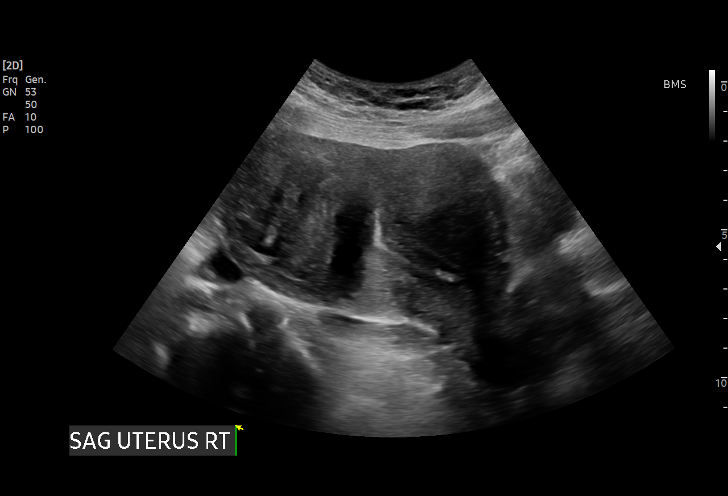
[im 20/120]
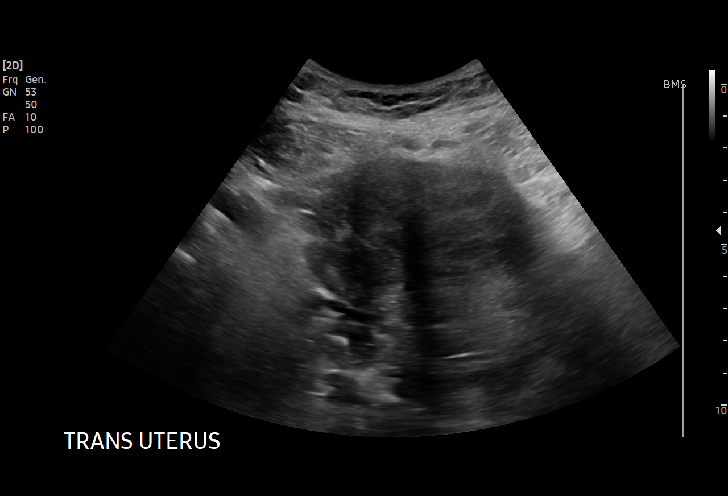
[im 30/120]
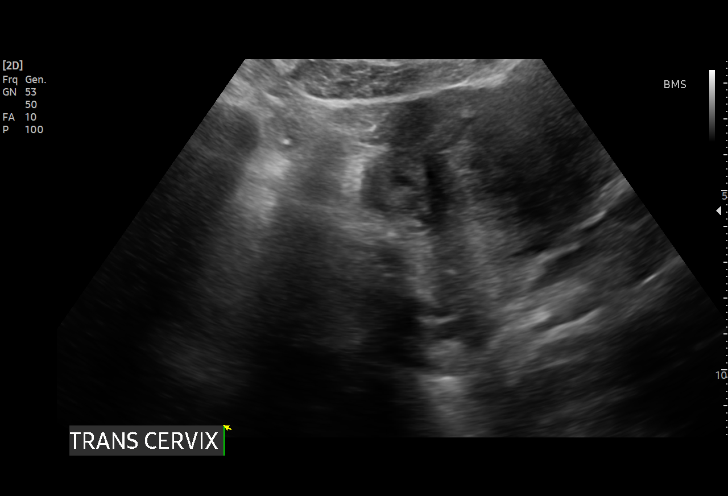
[im 40/120]
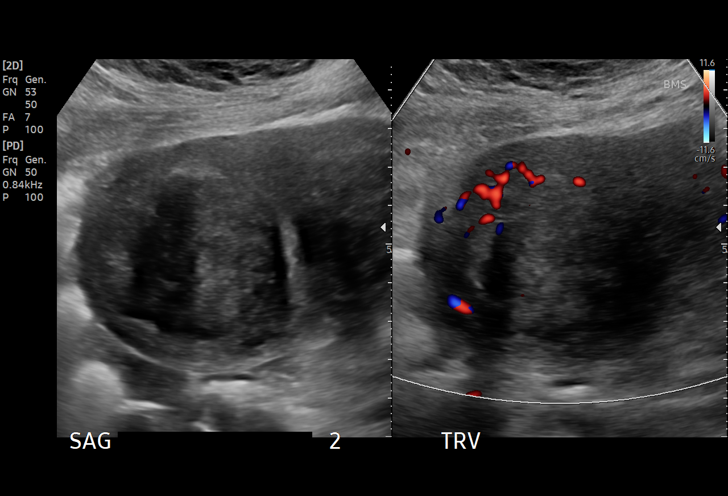
[im 45/120]
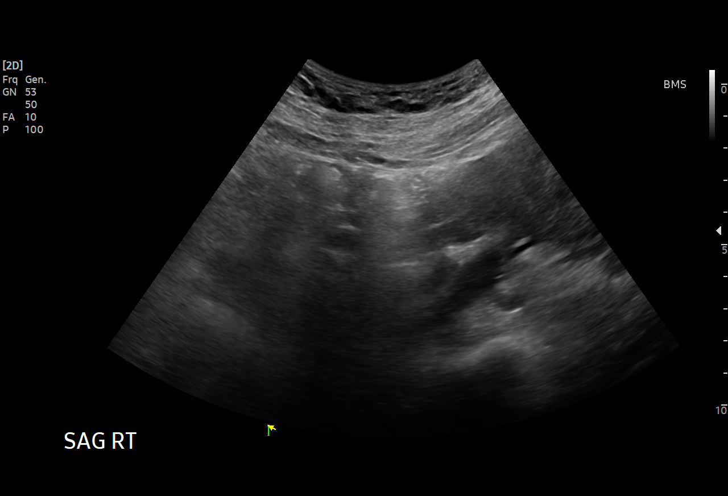
[im 55/120]
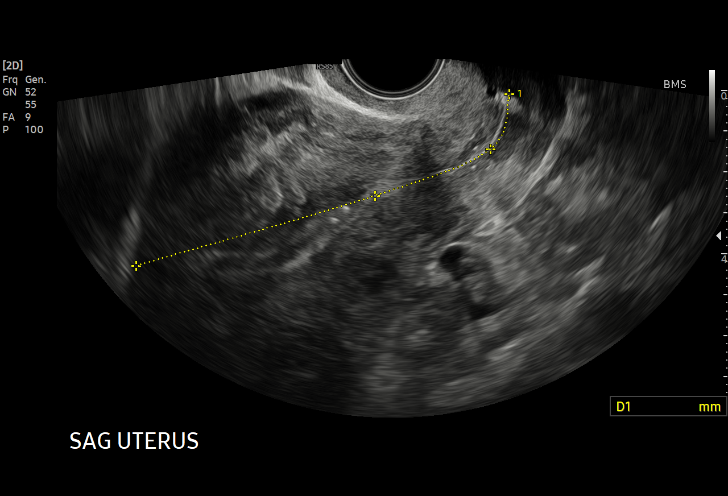
[im 65/120]
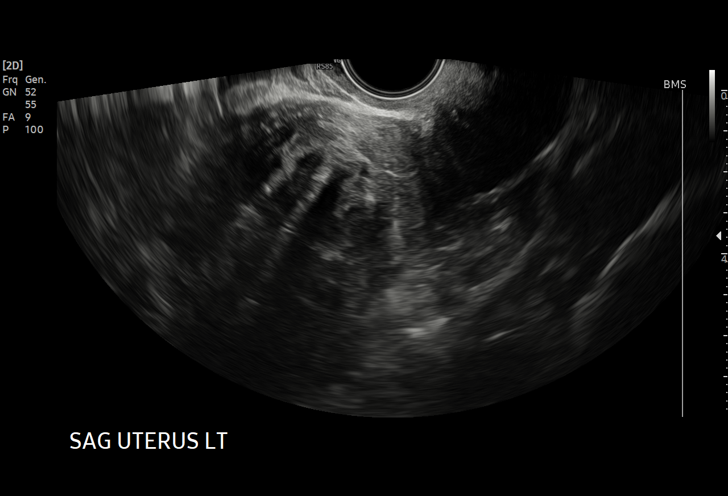
[im 75/120]
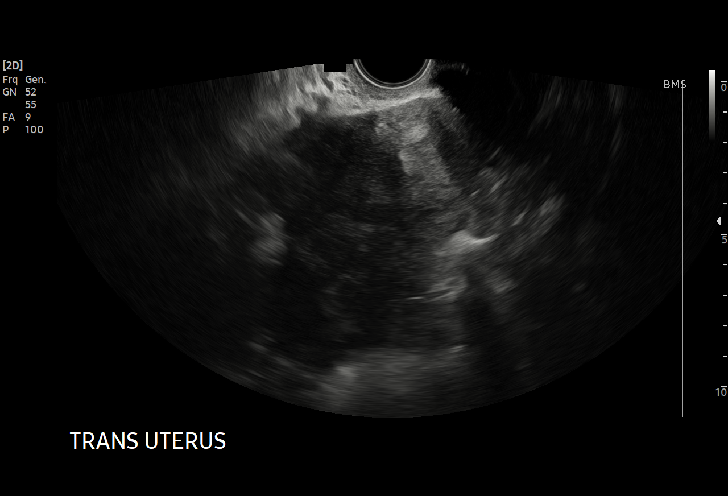
[im 80/120]
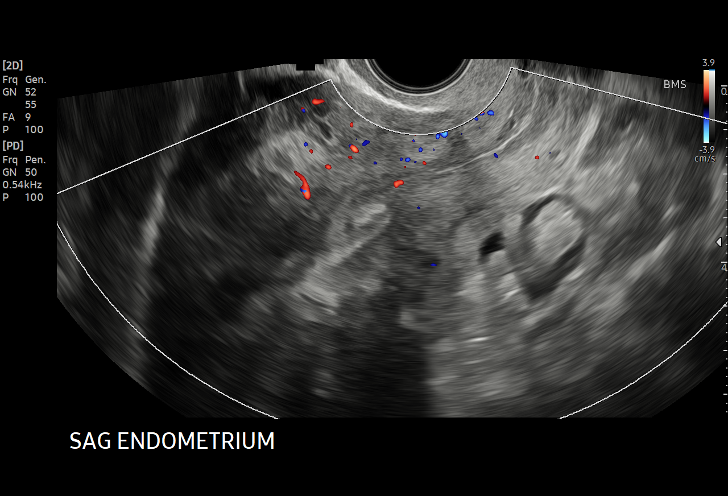
[im 90/120]
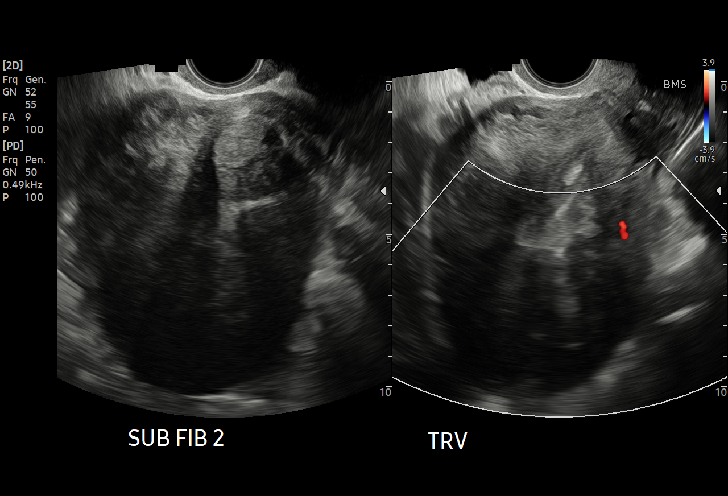
[im 100/120]
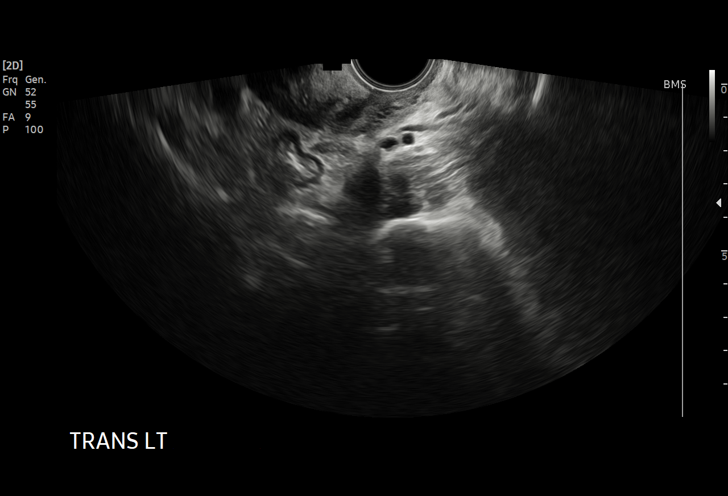
[im 110/120]
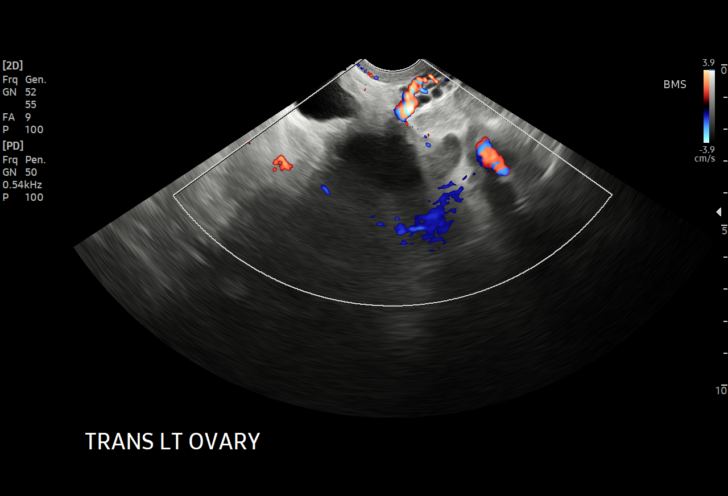
[im 120/120]
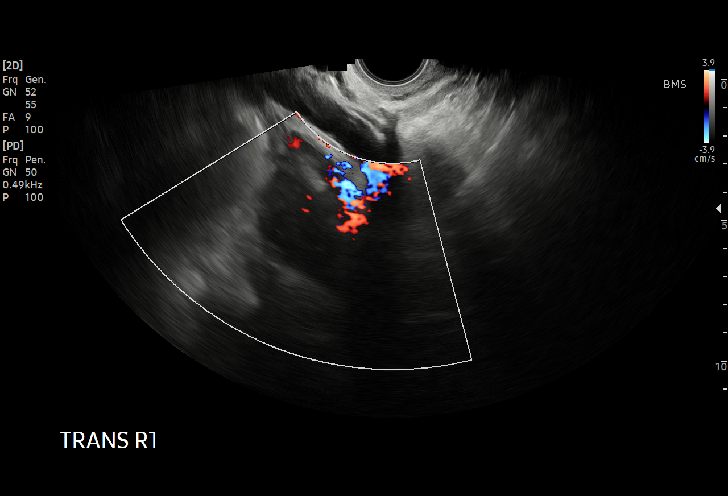

[14 of 25 positions shown; findings below may reference images not displayed]

FINDINGS: Uterus

Measurements: 11.8 x 8.5 x 7.9 cm = volume: 413.3 mL. There is
inhomogeneous echogenicity in myometrium. Multiple uterine fibroids
are seen. There is 3.3 x 2.8 x 3.1 cm fibroid in the left side of
fundus. There is 6.1 x 5.1 x 5 cm fibroid in the posterior fundus.
There is 3.1 x 2.4 x 2.7 cm fibroid in the lower uterine segment.

Endometrium

Thickness: 11.9 mm. There is 9 x 5 mm hyperechoic focus in the
central portion of body of the uterus. There is no significant
increased vascularity in the endometrium.

Right ovary

Measurements: 2.3 x 1.8 x 3 cm = volume: 6.4 mL. Normal
appearance/no adnexal mass.

Left ovary

Measurements: 3.3 x 2.1 x 2.3 cm = volume: 8.3 mL. Normal
appearance/no adnexal mass.

Pulsed Doppler evaluation of both ovaries demonstrates normal
low-resistance arterial and venous waveforms.

Other findings

No abnormal free fluid.
IMPRESSION: Enlarged uterus with multiple fibroids.

There is 9 x 5 mm hyperechoic focus in the endometrium within the
body of the uterus. Possibility of polyp is not excluded. Short-term
follow-up pelvic sonogram in 1-2 months may be considered to
reassess this finding.

There are no adnexal masses.  There is no free fluid in the pelvis.

ADDENDUM:
This addendum is made to clarify Doppler technique used for the
study. Color flow Doppler examination was done. Pulsed Doppler
examination was not performed.

*** End of Addendum ***
FINDINGS: Uterus

Measurements: 11.8 x 8.5 x 7.9 cm = volume: 413.3 mL. There is
inhomogeneous echogenicity in myometrium. Multiple uterine fibroids
are seen. There is 3.3 x 2.8 x 3.1 cm fibroid in the left side of
fundus. There is 6.1 x 5.1 x 5 cm fibroid in the posterior fundus.
There is 3.1 x 2.4 x 2.7 cm fibroid in the lower uterine segment.

Endometrium

Thickness: 11.9 mm. There is 9 x 5 mm hyperechoic focus in the
central portion of body of the uterus. There is no significant
increased vascularity in the endometrium.

Right ovary

Measurements: 2.3 x 1.8 x 3 cm = volume: 6.4 mL. Normal
appearance/no adnexal mass.

Left ovary

Measurements: 3.3 x 2.1 x 2.3 cm = volume: 8.3 mL. Normal
appearance/no adnexal mass.

Pulsed Doppler evaluation of both ovaries demonstrates normal
low-resistance arterial and venous waveforms.

Other findings

No abnormal free fluid.
IMPRESSION: Enlarged uterus with multiple fibroids.

There is 9 x 5 mm hyperechoic focus in the endometrium within the
body of the uterus. Possibility of polyp is not excluded. Short-term
follow-up pelvic sonogram in 1-2 months may be considered to
reassess this finding.

There are no adnexal masses.  There is no free fluid in the pelvis.

## 2022-11-09 ENCOUNTER — Telehealth: Payer: Self-pay

## 2022-11-09 NOTE — Telephone Encounter (Addendum)
Called pt back and notified her of dismissal from [facility] division per [facility] 02/2021. I have reviewed the documentation from that time and dismissal will stand for [facility].  Pt was advised if further details regarding dismissal are needed to contact [place] office directly.   Pt has been advised we cannot schedule at [facility].

## 2022-11-09 NOTE — Telephone Encounter (Signed)
Pt called to make appt and I notified the pt she was dismissed from our practice. Pt would like to know why

## 2022-11-09 NOTE — Telephone Encounter (Signed)
Pt checking on status of this message. Cb [phone]

## 2022-11-10 ENCOUNTER — Emergency Department (HOSPITAL_BASED_OUTPATIENT_CLINIC_OR_DEPARTMENT_OTHER)
Admission: EM | Admit: 2022-11-10 | Discharge: 2022-11-10 | Disposition: A | Discharge status: Home / Self Care | Payer: Medicaid Other | Attending: Emergency Medicine | Admitting: Emergency Medicine

## 2022-11-10 ENCOUNTER — Emergency Department (HOSPITAL_BASED_OUTPATIENT_CLINIC_OR_DEPARTMENT_OTHER): Payer: Medicaid Other

## 2022-11-10 ENCOUNTER — Other Ambulatory Visit: Payer: Self-pay

## 2022-11-10 ENCOUNTER — Encounter (HOSPITAL_BASED_OUTPATIENT_CLINIC_OR_DEPARTMENT_OTHER): Payer: Self-pay | Admitting: Emergency Medicine

## 2022-11-10 DIAGNOSIS — S8991XA Unspecified injury of right lower leg, initial encounter: Secondary | ICD-10-CM | POA: Diagnosis not present

## 2022-11-10 DIAGNOSIS — M79604 Pain in right leg: Secondary | ICD-10-CM | POA: Insufficient documentation

## 2022-11-10 MED ORDER — METHYLPREDNISOLONE 4 MG PO TBPK: ORAL_TABLET | ORAL | 0 refills | Status: DC

## 2022-11-10 NOTE — ED Triage Notes (Signed)
Pt arrives pov, slow gait, reports Aug injured RT leg, endorses continued pain  to distal RLE

## 2022-11-10 NOTE — Discharge Instructions (Signed)
X-rays overall unremarkable.  No fracture.  My suspicion is that there is some may be overuse process going on/inflammatory process.  I have prescribed you Medrol Dosepak to help with any inflammation.  Please follow-up with sports medicine.  wear walking boot for comfort.

## 2022-11-10 NOTE — ED Provider Notes (Signed)
[facility] Provider Note   CSN: [id number] Arrival date & time: 11/10/22  1041     History  Chief Complaint  Patient presents with   Leg Pain    Rebecca Watson is a 42 y.o. female.  Patient here with pain to the right anterior shin.  Ongoing for the last several months.  Happened after she struck her right shin back over the summer.  She has been doing a lot of rehab on her own with not much improvement.  She will still intermittently have a lot of pain to her right anterior shin.  Nothing makes it worse or better.  Over-the-counter medications have not helped much.  Has not had imaging done before.  Has not seen a provider particularly for this.  Denies any weakness or numbness or chills.  The history is provided by the patient.       Home Medications Prior to Admission medications   Medication Sig Start Date End Date Taking? Authorizing Provider  methylPREDNISolone (MEDROL DOSEPAK) 4 MG TBPK tablet Follow package insert 11/10/22  Yes Rebecca Neuner, DO  acetaminophen (TYLENOL) 500 MG tablet Take 1,000 mg by mouth every 6 (six) hours as needed for moderate pain or headache.    [provider]  cetirizine (ZYRTEC) 10 MG chewable tablet Chew 10 mg by mouth daily.    [provider]  cyclobenzaprine (FLEXERIL) 5 MG tablet Take 1 tablet (5 mg total) by mouth 3 (three) times daily as needed for muscle spasms. Patient not taking: Reported on 01/08/2022 10/16/19   Rebecca Ruddy, MD  diclofenac Sodium (VOLTAREN) 1 % GEL Apply topically 4 (four) times daily. Patient not taking: Reported on 01/08/2022    [provider]  dicyclomine (BENTYL) 20 MG tablet Take 1 tablet (20 mg total) by mouth 4 (four) times daily as needed. Patient not taking: Reported on 01/08/2022 01/30/21   Rebecca Park, MD  hydrOXYzine (ATARAX/VISTARIL) 50 MG tablet Take 1 tablet (50 mg total) by mouth 3 (three) times daily as needed for anxiety. Patient  not taking: Reported on 01/08/2022 10/15/19   Rebecca Ruddy, MD  mesalamine (LIALDA) 1.2 g EC tablet Take 4 tablets (4.8 g total) by mouth daily with breakfast. Patient not taking: Reported on 01/08/2022 01/30/21   Rebecca Park, MD  pantoprazole (PROTONIX) 40 MG tablet Take 1 tablet (40 mg total) by mouth every morning. Patient not taking: Reported on 01/08/2022 01/30/21   Rebecca Park, MD  PARoxetine (PAXIL) 20 MG tablet Take 1 tablet (20 mg total) by mouth daily. Patient not taking: Reported on 01/08/2022 03/08/21   Rebecca Ruddy, MD  Salicylic Acid 6 % CREA Apply 1 application topically daily. Patient not taking: Reported on 01/08/2022 03/20/21   Rebecca Watson, DPM      Allergies    Flagyl [metronidazole]    Review of Systems   Review of Systems  Physical Exam Updated Vital Signs BP (!) 148/99 (BP Location: Right Arm)   Pulse 74   Temp 97.8 F (36.6 C)   Resp 18   Ht '5\' 5"'$  (1.651 m)   Wt 98.4 kg   LMP 10/11/2022   SpO2 100%   BMI 36.11 kg/m  Physical Exam Vitals and nursing note reviewed.  Constitutional:      General: She is not in acute distress.    Appearance: She is well-developed.  HENT:     Head: Normocephalic and atraumatic.     Nose: Nose normal.  Mouth/Throat:     Mouth: Mucous membranes are moist.  Eyes:     Extraocular Movements: Extraocular movements intact.     Conjunctiva/sclera: Conjunctivae normal.     Pupils: Pupils are equal, round, and reactive to light.  Cardiovascular:     Rate and Rhythm: Normal rate and regular rhythm.     Pulses: Normal pulses.     Heart sounds: Normal heart sounds. No murmur heard. Pulmonary:     Effort: Pulmonary effort is normal. No respiratory distress.     Breath sounds: Normal breath sounds.  Abdominal:     Palpations: Abdomen is soft.     Tenderness: There is no abdominal tenderness.  Musculoskeletal:        General: Tenderness present. No swelling.     Cervical back: Neck supple.     Comments:  Tenderness to the right anterior shin  Skin:    General: Skin is warm and dry.     Capillary Refill: Capillary refill takes less than 2 seconds.  Neurological:     General: No focal deficit present.     Mental Status: She is alert and oriented to person, place, and time.     Cranial Nerves: No cranial nerve deficit.     Sensory: No sensory deficit.     Motor: No weakness.     Coordination: Coordination normal.  Psychiatric:        Mood and Affect: Mood normal.     ED Results / Procedures / Treatments   Labs (all labs ordered are listed, but only abnormal results are displayed) Labs Reviewed - No data to display  EKG None  Radiology DG Tibia/Fibula Right  Result Date: 11/10/2022 CLINICAL DATA:  Right leg injury, endorses continued pain to distal RLE EXAM: RIGHT TIBIA AND FIBULA - 2 VIEW COMPARISON:  None Available. FINDINGS: There is no evidence of fracture or other focal bone lesions. Soft tissues are unremarkable. IMPRESSION: Negative. Electronically Signed   By: Rebecca Watson M.D.   On: 11/10/2022 11:38    Procedures Procedures    Medications Ordered in ED Medications - No data to display  ED Course/ Medical Decision Making/ A&P                             Medical Decision Making Amount and/or Complexity of Data Reviewed Radiology: ordered.  Risk Prescription drug management.   Rebecca Watson is here with pain to the right anterior shin.  Pain on and off for couple months.  Differential diagnosis likely stress fracture to the right shin area, overuse process she has never had this area imaged before.  Have no concern for DVT or peripheral arterial disease.  She is neurovascular neuromuscular intact.  She has strong pulses in her legs.  She has no swelling in the legs.  X-ray per my review and interpretation shows no fracture or malalignment.  Will put her in a walking boot and have her start a Medrol Dosepak and follow-up with sports medicine.  Discharged in  good condition.  This chart was dictated using voice recognition software.  Despite best efforts to proofread,  errors can occur which can change the documentation meaning.         Final Clinical Impression(s) / ED Diagnoses Final diagnoses:  Right leg pain    Rx / DC Orders ED Discharge Orders          Ordered    methylPREDNISolone (MEDROL DOSEPAK) 4  MG TBPK tablet        11/10/22 1117              Lennice Sites, DO 11/10/22 1146

## 2022-11-14 ENCOUNTER — Encounter: Payer: Self-pay | Admitting: Family Medicine

## 2022-11-14 ENCOUNTER — Other Ambulatory Visit (HOSPITAL_COMMUNITY): Payer: Self-pay

## 2022-11-14 ENCOUNTER — Ambulatory Visit (INDEPENDENT_AMBULATORY_CARE_PROVIDER_SITE_OTHER): Payer: Medicaid Other | Admitting: Family Medicine

## 2022-11-14 VITALS — BP 124/70 | Ht 66.0 in | Wt 217.0 lb

## 2022-11-14 DIAGNOSIS — M5416 Radiculopathy, lumbar region: Secondary | ICD-10-CM

## 2022-11-14 MED ORDER — MELOXICAM 15 MG PO TABS
15.0000 mg | ORAL_TABLET | Freq: Every morning | ORAL | 1 refills | Status: DC
  Filled 2022-11-14: qty 60, 60d supply, fill #0

## 2022-11-14 NOTE — Patient Instructions (Signed)
Nice to meet you Please try the exercises  Please do not take aspirin with the mobic  We'll send you for an EMG/nerve study   Please send me a message in [place] with any questions or updates.  We'll setup a virtual visit once the EMG is completed.   --Dr. Raeford Razor

## 2022-11-14 NOTE — Assessment & Plan Note (Signed)
Acute on chronic in nature.  She notices pain in the anterior shin as well as the posterior aspect of the knee with limited flexion of the lower leg due to exacerbation of pain.  Possible for nerve irritation with complex regional pain syndrome being in the equation. -Counseled on home exercise therapy and supportive care. -Discussed gabapentin she would like to avoid at this time. -Provided meloxicam. -Pursue EMG. -Could consider physical therapy

## 2022-11-14 NOTE — Progress Notes (Signed)
  Rebecca Watson - 42 y.o. female MRN [id number]  Date of birth: 03/09/1981  SUBJECTIVE:  Including CC & ROS.  No chief complaint on file.   Rebecca Watson is a 42 y.o. female that is presenting with right leg pain.  She notices pain in the anterior shin but also in the posterior aspect of the lower extremity.  This is ongoing since August.  The pain is inconsistent in nature.  No history of surgery.  Review of the emergency department note from 1/13 shows she was provided a Medrol Dosepak. Independent review of the right tibia/fibula x-ray from 1/13 shows no acute changes.  Review of Systems See HPI   HISTORY: Past Medical, Surgical, Social, and Family History Reviewed & Updated per EMR.   Pertinent Historical Findings include:  Past Medical History:  Diagnosis Date   Allergy    Anemia    low FE per pt    Constipation    Diverticulitis    Diverticulosis    Dry eyes    GERD (gastroesophageal reflux disease)    Obesity    Thyroid disease    Trouble in sleeping     Past Surgical History:  Procedure Laterality Date   CESAREAN SECTION     with twins  x 1    IUD REMOVAL     broken IUD removed    TUBAL LIGATION     x both      PHYSICAL EXAM:  VS: BP 124/70   Ht '5\' 6"'$  (1.676 m)   Wt 217 lb (98.4 kg)   LMP 10/11/2022   BMI 35.02 kg/m  Physical Exam Gen: NAD, alert, cooperative with exam, well-appearing MSK:  Neurovascularly intact       ASSESSMENT & PLAN:   Lumbar radiculopathy Acute on chronic in nature.  She notices pain in the anterior shin as well as the posterior aspect of the knee with limited flexion of the lower leg due to exacerbation of pain.  Possible for nerve irritation with complex regional pain syndrome being in the equation. -Counseled on home exercise therapy and supportive care. -Discussed gabapentin she would like to avoid at this time. -Provided meloxicam. -Pursue EMG. -Could consider physical therapy

## 2022-11-15 NOTE — Addendum Note (Signed)
Addended by: Cresenciano Lick on: 11/15/2022 02:24 PM   Modules accepted: Orders

## 2022-11-22 ENCOUNTER — Encounter: Payer: Self-pay | Admitting: Family Medicine

## 2022-11-22 ENCOUNTER — Other Ambulatory Visit (HOSPITAL_COMMUNITY): Payer: Self-pay

## 2022-11-30 ENCOUNTER — Telehealth: Payer: Self-pay

## 2022-11-30 ENCOUNTER — Other Ambulatory Visit: Payer: Self-pay | Admitting: Family Medicine

## 2022-11-30 ENCOUNTER — Other Ambulatory Visit (HOSPITAL_COMMUNITY): Payer: Self-pay

## 2022-11-30 MED ORDER — GABAPENTIN 300 MG PO CAPS
300.0000 mg | ORAL_CAPSULE | Freq: Three times a day (TID) | ORAL | 1 refills | Status: DC
  Filled 2022-11-30: qty 90, 30d supply, fill #0
  Filled 2023-01-09: qty 90, 30d supply, fill #1

## 2022-11-30 NOTE — Telephone Encounter (Signed)
Provided gabapentin  Rosemarie Ax, MD [facility] 11/30/2022, 1:19 PM

## 2022-11-30 NOTE — Telephone Encounter (Signed)
Stacy from Dr. Raeford Razor office called and wanted to know if Dr. Ernestina Patches can review patient and see if a lower extremity NCV can be done on patient. Please advise

## 2022-12-03 ENCOUNTER — Telehealth: Payer: Self-pay

## 2022-12-03 ENCOUNTER — Other Ambulatory Visit: Payer: Self-pay

## 2022-12-03 DIAGNOSIS — R202 Paresthesia of skin: Secondary | ICD-10-CM

## 2022-12-03 NOTE — Telephone Encounter (Signed)
-----   Message from Rondall Allegra sent at 12/03/2022  2:14 PM EST ----- Charisse March can you please contact, see message below ----- Message ----- From: Cyd Silence, CMA Sent: 12/03/2022   1:34 PM EST To: Cresenciano Lick, CMA; Carleene Cooper  Can you help [place] expedite something please?....... I think [place] was supposed to be getting back to [place] on Dr Ernestina Patches seeing this pt..... Can you assist?

## 2022-12-03 NOTE — Telephone Encounter (Signed)
Spoke to [place] and informed her that she may have to try [facility] or [facility] for lower extremity NCV

## 2022-12-03 NOTE — Telephone Encounter (Signed)
Informed Rebecca Watson that Dr. Ernestina Patches does not usually do lower extremity NCV and he suggested [facility] or [facility]

## 2022-12-03 NOTE — Addendum Note (Signed)
Addended by: Cresenciano Lick on: 12/03/2022 02:29 PM   Modules accepted: Orders

## 2022-12-06 ENCOUNTER — Ambulatory Visit: Payer: Medicaid Other | Admitting: Neurology

## 2022-12-06 DIAGNOSIS — R202 Paresthesia of skin: Secondary | ICD-10-CM | POA: Diagnosis not present

## 2022-12-06 NOTE — Procedures (Signed)
  [facility]  [place], [place]  [place], [place] [address] Tel: [phone] Fax: [phone] Test Date:  12/06/2022  Patient: Rebecca Watson DOB: [birth year] Physician: Narda Amber, DO  Sex: Female Height: '5\' 6"'$  Ref Phys: Clearance Coots, MD  ID#: [id number]   Technician:    History: This is a 42 year old female referred for evaluation of right leg pain.  NCV & EMG Findings: Extensive electrodiagnostic testing of the right lower extremity shows: Right sural and superficial peroneal sensory responses are within normal limits. Right peroneal and tibial motor responses are within normal limits. Right tibial H reflex study is within normal limits. There is no evidence of active or chronic motor axonal loss changes affecting any of the tested muscles.  Motor unit configuration and recruitment pattern is within normal limits.   Impression: This is a normal study of the right lower extremity.  In particular, there is no evidence of a lumbosacral radiculopathy or large fiber sensorimotor polyneuropathy.   ___________________________ Narda Amber, DO    Nerve Conduction Studies   Stim Site NR Peak (ms) Norm Peak (ms) O-P Amp (V) Norm O-P Amp  Right Sup Peroneal Anti Sensory (Ant Lat Mall)  33 C  12 cm    2.4 <4.5 11.4 >5  Right Sural Anti Sensory (Lat Mall)  33 C  Calf    2.7 <4.5 15.4 >5     Stim Site NR Onset (ms) Norm Onset (ms) O-P Amp (mV) Norm O-P Amp Site1 Site2 Delta-0 (ms) Dist (cm) Vel (m/s) Norm Vel (m/s)  Right Peroneal Motor (Ext Dig Brev)  33 C  Ankle    2.3 <5.5 8.6 >3 B Fib Ankle 7.5 38.0 51 >40  B Fib    9.8  7.8  Poplt B Fib 1.5 8.0 53 >40  Poplt    11.3  7.5         Right Tibial Motor (Abd Hall Brev)  33 C  Ankle    3.1 <6.0 9.2 >8 Knee Ankle 8.3 41.0 49 >40  Knee    11.4  8.1          Electromyography   Side Muscle Ins.Act Fibs Fasc Recrt Amp Dur Poly Activation Comment  Right AntTibialis Nml Nml Nml Nml Nml Nml Nml Nml N/A   Right Gastroc Nml Nml Nml Nml Nml Nml Nml Nml N/A  Right Flex Dig Long Nml Nml Nml Nml Nml Nml Nml Nml N/A  Right RectFemoris Nml Nml Nml Nml Nml Nml Nml Nml N/A  Right BicepsFemS Nml Nml Nml Nml Nml Nml Nml Nml N/A  Right GluteusMed Nml Nml Nml Nml Nml Nml Nml Nml N/A      Waveforms:

## 2022-12-12 ENCOUNTER — Telehealth (INDEPENDENT_AMBULATORY_CARE_PROVIDER_SITE_OTHER): Payer: Medicaid Other | Admitting: Family Medicine

## 2022-12-12 DIAGNOSIS — M5416 Radiculopathy, lumbar region: Secondary | ICD-10-CM

## 2022-12-12 NOTE — Assessment & Plan Note (Signed)
EMG of the right lower extremity was reassuring with no significant structural contribution. -Counseled on home exercise therapy and supportive care. -Referral to physical therapy. -Could consider further imaging.

## 2022-12-12 NOTE — Progress Notes (Signed)
Virtual Visit via Video Note  I connected with Mattie Marlin on 12/12/22 at  1:00 PM EST by a video enabled telemedicine application and verified that I am speaking with the correct person using two identifiers.  Location: Patient: vehicle  Provider: office   I discussed the limitations of evaluation and management by telemedicine and the availability of in person appointments. The patient expressed understanding and agreed to proceed.  History of Present Illness:  Ms. Fournet is a 42 yo female that is following up after the nerve study of her right lower extremity.  This was revealing a normal study with no evidence of lumbosacral radiculopathy or large fiber sensorimotor polyneuropathy.   Observations/Objective:   Assessment and Plan:  Lumbar radiculopathy: EMG of the right lower extremity was reassuring with no significant structural contribution. -Counseled on home exercise therapy and supportive care. -Referral to physical therapy. -Could consider further imaging.  Follow Up Instructions:    I discussed the assessment and treatment plan with the patient. The patient was provided an opportunity to ask questions and all were answered. The patient agreed with the plan and demonstrated an understanding of the instructions.   The patient was advised to call back or seek an in-person evaluation if the symptoms worsen or if the condition fails to improve as anticipated.    Clearance Coots, MD

## 2022-12-13 ENCOUNTER — Encounter: Payer: Self-pay | Admitting: Family Medicine

## 2022-12-28 NOTE — Therapy (Signed)
OUTPATIENT PHYSICAL THERAPY THORACOLUMBAR EVALUATION   Patient Name: Rebecca Watson MRN: [id number] DOB:[birth year], 42 y.o., female Today's Date: 01/02/2023  END OF SESSION:  PT End of Session - 01/02/23 1020     Visit Number 1    Date for PT Re-Evaluation 02/13/23    Authorization Type [place] [employer]    PT Start Time 1020    PT Stop Time 1136    PT Time Calculation (min) 76 min    Activity Tolerance Patient tolerated treatment well    Behavior During Therapy WFL for tasks assessed/performed             Past Medical History:  Diagnosis Date   Allergy    Anemia    low FE per pt    Constipation    Diverticulitis    Diverticulosis    Dry eyes    GERD (gastroesophageal reflux disease)    Obesity    Thyroid disease    Trouble in sleeping    Past Surgical History:  Procedure Laterality Date   CESAREAN SECTION     with twins  x 1    IUD REMOVAL     broken IUD removed    TUBAL LIGATION     x both    Patient Active Problem List   Diagnosis Date Noted   Lumbar radiculopathy 11/14/2022   Vaginal discharge 10/12/2020   Anxiety and depression 10/12/2020   IUD (intrauterine device) in place 10/12/2020   Complex tear of lateral meniscus of right knee as current injury 08/04/2018   Unilateral primary osteoarthritis, right knee 08/04/2018   Chronic pain of right knee 08/04/2018   Endometriosis determined by laparoscopy 12/05/2017    PCP: Pcp, No   REFERRING PROVIDER: Rosemarie Ax, MD    REFERRING DIAG: M54.16 (ICD-10-CM) - Lumbar radiculopathy  THERAPY DIAG:  Other low back pain  Pain in right leg  Muscle weakness (generalized)  Other muscle spasm  RATIONALE FOR EVALUATION AND TREATMENT: Rehabilitation  ONSET DATE: July 2023  NEXT MD VISIT:  None scheduled   SUBJECTIVE:                                                                                                                                                                                                          SUBJECTIVE STATEMENT: Pt reports onset on pain in R lower leg starting in July 2023 after hitting the bed with her R shin. Pain seemed to extend into her calf and up her thigh. Back pain started 2-3 weeks later which she attributes to trying  to work out the pain in her leg. R foot occasional swells so wears compression socks. MD notes indicate "Possible for nerve irritation with complex regional pain syndrome being in the equation".   PAIN: Are you having pain? Yes: NPRS scale: currently 4/10, at worst up to 8/10 Pain location: B low back, occasionally extending up along the spine Pain description: sharp Aggravating factors: prolonged standing, bending down too fast, twisting Relieving factors: gabapentin & Tylenol  Are you having pain? Yes: NPRS scale: 4/10 Pain location: R distal LE - shin and calf Pain description: burning and throbbing Aggravating factors: unpredictable  Relieving factors: maybe gabapentin & Tylenol  PERTINENT HISTORY:  R knee - lateral meniscus tear; OA - R knee; chronic pain R knee; GERD; thyroid disease; obesity  PRECAUTIONS: None  WEIGHT BEARING RESTRICTIONS: No  FALLS:  Has patient fallen in last 6 months? No  LIVING ENVIRONMENT: Lives with: lives alone Lives in: House/apartment Stairs: No Has following equipment at home: None  OCCUPATION: Nurse - FT in long-term care  PLOF: Independent and Leisure: internet yoga, Zumba classes, paces a lot   PATIENT GOALS: "To figure out what I can do at home."   OBJECTIVE: (objective measures completed at initial evaluation unless otherwise dated)  DIAGNOSTIC FINDINGS:  12/06/22 - NCV & EMG Findings: Extensive electrodiagnostic testing of the right lower extremity shows: Right sural and superficial peroneal sensory responses are within normal limits. Right peroneal and tibial motor responses are within normal limits. Right tibial H reflex study is within normal limits. There is no evidence  of active or chronic motor axonal loss changes affecting any of the tested muscles.  Motor unit configuration and recruitment pattern is within normal limits. Impression: This is a normal study of the right lower extremity.  In particular, there is no evidence of a lumbosacral radiculopathy or large fiber sensorimotor polyneuropathy.  11/10/22 - R tib/fib x-ray: FINDINGS: There is no evidence of fracture or other focal bone lesions. Soft tissues are unremarkable.   IMPRESSION: Negative.  PATIENT SURVEYS:  Modified Oswestry 14 / 50 = 28.0 %  LEFS 51 / 80 = 63.7 %  SCREENING FOR RED FLAGS: Bowel or bladder incontinence: No Spinal tumors: No Cauda equina syndrome: No Compression fracture: No Abdominal aneurysm: No  COGNITION:  Overall cognitive status: Within functional limits for tasks assessed    SENSATION: WFL Reports prior numbness and tingling but none recently  EDEMA: Mild R ankle and distal LE edema  MUSCLE LENGTH: Hamstrings: mild/mod tight R>L ITB: mild tight R Piriformis: mod tight R Hip flexors: mild tight B Quads: mild tight B Heelcord: mild/mod tight R>L  POSTURE:  B genu valgum and B genu recurvatum  PALPATION: Increased muscle tension and TTP with multiple taut bands and TPs over R anterior tibialis, peroneals, medial/lateral gastroc, and L glutes.  LUMBAR ROM:   Active  AROM  eval  Flexion Hands to ankles *  Extension 50% limited *  Right lateral flexion WFL  Left lateral flexion WFL *  Right rotation WFL  Left rotation WFL *  (Blank rows = not tested, * = pain)  LOWER EXTREMITY ROM:     Active  Right eval Left eval  Hip flexion    Hip extension    Hip abduction    Hip adduction    Hip internal rotation    Hip external rotation    Knee flexion 106 139  Knee extension +2 -2 hyperextension  Ankle dorsiflexion 1 6  Ankle plantarflexion 50  54  Ankle inversion 38 38  Ankle eversion 10 18  (Blank rows = not tested)  LOWER EXTREMITY  MMT:    MMT Right eval Left eval  Hip flexion 4 4+  Hip extension 4 4  Hip abduction 4 4  Hip adduction 4- 4-  Hip internal rotation 4+ 5  Hip external rotation 3+ 4-  Knee flexion 4 5  Knee extension 4 4+  Ankle dorsiflexion 4- * 4+  Ankle plantarflexion 4- (7 SLS HR) 5  Ankle inversion 4- 5  Ankle eversion 4- 5   (Blank rows = not tested, * = pain)  LUMBAR SPECIAL TESTS:  Straight leg raise test: Negative   TODAY'S TREATMENT:   01/02/23 THERAPEUTIC EXERCISE: Instruction in initial HEP to improve flexibility, strength and mobility.  Verbal and tactile cues throughout for technique.  Hooklying R KTOS piriformis stretch x 30" - pt not feeling much stretch Hooklying R figure-4 piriformis stretch with overpressure x 30" Hooklying R piriformis stretch pulling heel to hip x 30"  - 2 x daily - 7 x weekly - 3 reps - 30 sec hold Long-sitting R gastroc stretch with strap x 30" Long-sitting R hip hinge HS stretch with strap x 30"   Long-sitting R soleus stretch with strap x 30"  THERAPEUTIC ACTIVITIES:  Provided instruction in self manual TrP release. Provided instruction in self-STM using foam roller to R tib anterior, peroneals and gastroc/soleus.   PATIENT EDUCATION:  Education details: PT eval findings, anticipated POC, initial HEP, self-STM techniques to R tib anterior, peroneals and gastroc/soleus using foam roller, and role of DN Person educated: Patient Education method: Explanation, Demonstration, Verbal cues, Handouts, and Independence code texted to patient Education comprehension: verbalized understanding, returned demonstration, and needs further education  HOME EXERCISE PROGRAM: Access Code: [email or web address] URL: [email or web address] Date: 01/02/2023 Prepared by: Annie Paras  Exercises - Supine Piriformis Stretch Pulling Heel to Hip  - 2 x daily - 7 x weekly - 3 reps - 30 sec hold - Supine Figure 4 Piriformis Stretch  - 2 x daily - 7 x weekly - 3 reps -  30 sec hold - Seated Table Hamstring Stretch (Mirrored)  - 2 x daily - 7 x weekly - 3 reps - 30 sec hold - Long Sitting Calf Stretch with Strap  - 2 x daily - 7 x weekly - 3 reps - 30 sec hold - Long Sitting Soleus Stretch on Bolster with Strap  - 2 x daily - 7 x weekly - 3 reps - 30 sec hold - Peroneal stretch  - 2 x daily - 7 x weekly - 3 reps - 30 sec hold - Calf Mobilization with Foam Roll  - 1 x daily - 7 x weekly - 2 sets - 10 reps - 3 sec hold  Patient Education - Trigger Point Dry Needling   ASSESSMENT:  CLINICAL IMPRESSION: Rebecca Watson is a 42 y.o. female  who was seen today for physical therapy evaluation and treatment for lumbar radiculopathy.  She reports pain first an issue in her right lower leg after hitting her shin on the bed in July 2023 but states since then the pain has extended into her calf and up the posterior leg, with onset of back pain approximately 2-3 weeks later potentially as a result of stretching and activities she was doing to try to alleviate the LE pain.  Current deficits include chronic low back pain and right lower extremity pain, decreased flexibility, abnormal muscle tension with multiple  taut bands and trigger points, decreased and painful lumbar, R knee and ankle range of motion, abnormal posture and LE alignment, and decreased core and LE strength.  Above pain and deficits limits her tolerance for her ADLs and work-related tasks, as well as positioning for exercises and yoga.  Rebecca Watson will benefit from skilled PT to address above deficits to improve mobility and activity tolerance with decreased pain interference.   OBJECTIVE IMPAIRMENTS: Abnormal gait, decreased activity tolerance, decreased balance, decreased endurance, decreased mobility, difficulty walking, decreased ROM, decreased strength, hypomobility, increased edema, increased fascial restrictions, impaired perceived functional ability, increased muscle spasms, impaired flexibility, improper  body mechanics, postural dysfunction, and pain.   ACTIVITY LIMITATIONS: carrying, lifting, bending, sitting, standing, squatting, sleeping, stairs, transfers, locomotion level, and caring for others  PARTICIPATION LIMITATIONS: meal prep, cleaning, laundry, interpersonal relationship, community activity, and occupation  PERSONAL FACTORS: Fitness, Past/current experiences, Profession, Time since onset of injury/illness/exacerbation, and 3+ comorbidities: R knee - lateral meniscus tear; OA - R knee; chronic pain R knee; GERD; thyroid disease; obesity  are also affecting patient's functional outcome.   REHAB POTENTIAL: Good  CLINICAL DECISION MAKING: Evolving/moderate complexity  EVALUATION COMPLEXITY: Moderate   GOALS: Goals reviewed with patient? Yes  SHORT TERM GOALS: Target date: 01/23/2023   Patient will be independent with initial HEP to improve outcomes and carryover.  Baseline: Initial HEP provided on eval Goal status: INITIAL  2.  Patient to demonstrate ability to achieve and maintain good spinal alignment/posturing and body mechanics needed for daily activities. Baseline:  Goal status: INITIAL   LONG TERM GOALS: Target date: 02/13/2023  Patient will be independent with ongoing/advanced HEP for self-management at home.  Baseline:  Goal status: INITIAL  2.  Patient will report 50-75% improvement in low back pain and R LE pain to improve QOL.  Baseline: 4/10 Goal status: INITIAL  3.  Patient will demonstrate full pain free lumbar ROM to perform ADLs. Baseline: Refer to above lumbar ROM table Goal status: INITIAL  4.  Patient to improve R knee and ankle AROM to [facility] without pain provocation.   Baseline: Refer to above LE ROM table Goal status: INITIAL  5.  Patient will demonstrate improved B LE strength to >/= 4+/5 for improved stability and ease of mobility . Baseline: Refer to above MMT table Goal status: INITIAL  6.  Patient will report >/= 60/80 on LEFS to  demonstrate improved functional ability with decreased R LE pain interference.  Baseline: 51 / 80 = 63.7 % Goal status: INITIAL   7. Patient will report </= 16% (8/50) on Modified Oswestry to demonstrate improved functional ability with decreased low back pain interference. Baseline: 14 / 50 = 28.0 % Goal status: INITIAL  8.  Patient will tolerate 20-30 min of sitting, standing or walking w/o increased pain to allow for  improved mobility and activity tolerance for job performance. 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 Goal status: INITIAL   PLAN:  PT FREQUENCY: 1-2x/week - 2x/wk initially, potentially tapering to 1/xwk with increased emphasis on HEP  PT DURATION: 6 weeks  PLANNED INTERVENTIONS: Therapeutic exercises, Therapeutic activity, Neuromuscular re-education, Balance training, Gait training, Patient/Family education, Self Care, Joint mobilization, Stair training, Aquatic Therapy, Dry Needling, Spinal mobilization, Cryotherapy, Moist heat, Taping, Ultrasound, Manual therapy, and Re-evaluation  PLAN FOR NEXT SESSION: Review initial HEP; progress lumbar and LE flexibility and stretching; initiate lumbopelvic strengthening and stabilization as well as LE strengthening; MT +/-  DN to low back, right glutes and LE as indicated   Percival Spanish, PT 01/02/2023, 12:29 PM

## 2023-01-02 ENCOUNTER — Encounter: Payer: Self-pay | Admitting: Physical Therapy

## 2023-01-02 ENCOUNTER — Ambulatory Visit: Payer: Medicaid Other | Attending: Family Medicine | Admitting: Physical Therapy

## 2023-01-02 ENCOUNTER — Other Ambulatory Visit: Payer: Self-pay

## 2023-01-02 DIAGNOSIS — M5459 Other low back pain: Secondary | ICD-10-CM | POA: Diagnosis not present

## 2023-01-02 DIAGNOSIS — M5416 Radiculopathy, lumbar region: Secondary | ICD-10-CM | POA: Diagnosis not present

## 2023-01-02 DIAGNOSIS — M62838 Other muscle spasm: Secondary | ICD-10-CM | POA: Insufficient documentation

## 2023-01-02 DIAGNOSIS — M6281 Muscle weakness (generalized): Secondary | ICD-10-CM | POA: Insufficient documentation

## 2023-01-02 DIAGNOSIS — M79604 Pain in right leg: Secondary | ICD-10-CM | POA: Insufficient documentation

## 2023-01-04 ENCOUNTER — Ambulatory Visit: Payer: Medicaid Other

## 2023-01-07 ENCOUNTER — Ambulatory Visit: Payer: Medicaid Other

## 2023-01-07 DIAGNOSIS — M5459 Other low back pain: Secondary | ICD-10-CM

## 2023-01-07 DIAGNOSIS — M5416 Radiculopathy, lumbar region: Secondary | ICD-10-CM | POA: Diagnosis not present

## 2023-01-07 DIAGNOSIS — M62838 Other muscle spasm: Secondary | ICD-10-CM | POA: Diagnosis not present

## 2023-01-07 DIAGNOSIS — M6281 Muscle weakness (generalized): Secondary | ICD-10-CM | POA: Diagnosis not present

## 2023-01-07 DIAGNOSIS — M79604 Pain in right leg: Secondary | ICD-10-CM

## 2023-01-07 NOTE — Therapy (Signed)
OUTPATIENT PHYSICAL THERAPY TREATMENT   Patient Name: Rebecca Watson MRN: [id number] DOB:1981/08/31, 42 y.o., female Today's Date: 01/07/2023  END OF SESSION:  PT End of Session - 01/07/23 1030     Visit Number 2    Date for PT Re-Evaluation 02/13/23    Authorization Type [place] Medicaid    Authorization Time Period 01/04/23- 03/04/23    PT Start Time 0933    PT Stop Time 1015    PT Time Calculation (min) 42 min    Activity Tolerance Patient tolerated treatment well    Behavior During Therapy WFL for tasks assessed/performed              Past Medical History:  Diagnosis Date   Allergy    Anemia    low FE per pt    Constipation    Diverticulitis    Diverticulosis    Dry eyes    GERD (gastroesophageal reflux disease)    Obesity    Thyroid disease    Trouble in sleeping    Past Surgical History:  Procedure Laterality Date   CESAREAN SECTION     with twins  x 1    IUD REMOVAL     broken IUD removed    TUBAL LIGATION     x both    Patient Active Problem List   Diagnosis Date Noted   Lumbar radiculopathy 11/14/2022   Vaginal discharge 10/12/2020   Anxiety and depression 10/12/2020   IUD (intrauterine device) in place 10/12/2020   Complex tear of lateral meniscus of right knee as current injury 08/04/2018   Unilateral primary osteoarthritis, right knee 08/04/2018   Chronic pain of right knee 08/04/2018   Endometriosis determined by laparoscopy 12/05/2017    PCP: Pcp, No   REFERRING PROVIDER: Rosemarie Ax, MD    REFERRING DIAG: M54.16 (ICD-10-CM) - Lumbar radiculopathy  THERAPY DIAG:  Other low back pain  Pain in right leg  Muscle weakness (generalized)  Other muscle spasm  RATIONALE FOR EVALUATION AND TREATMENT: Rehabilitation  ONSET DATE: July 2023  NEXT MD VISIT:  None scheduled   SUBJECTIVE:                                                                                                                                                                                                          SUBJECTIVE STATEMENT: Pt reports trying a stretch extending her back but it was too much of a stretch   PAIN: Are you having pain? Yes: NPRS scale: 6/10 Pain location: L low back Pain description: throbbing  Aggravating factors: prolonged standing, bending down too fast, twisting Relieving factors: gabapentin & Tylenol  Are you having pain? No- none in R shin  PERTINENT HISTORY:  R knee - lateral meniscus tear; OA - R knee; chronic pain R knee; GERD; thyroid disease; obesity  PRECAUTIONS: None  WEIGHT BEARING RESTRICTIONS: No  FALLS:  Has patient fallen in last 6 months? No  LIVING ENVIRONMENT: Lives with: lives alone Lives in: House/apartment Stairs: No Has following equipment at home: None  OCCUPATION: Nurse - FT in long-term care  PLOF: Independent and Leisure: internet yoga, Zumba classes, paces a lot   PATIENT GOALS: "To figure out what I can do at home."   OBJECTIVE: (objective measures completed at initial evaluation unless otherwise dated)  DIAGNOSTIC FINDINGS:  12/06/22 - NCV & EMG Findings: Extensive electrodiagnostic testing of the right lower extremity shows: Right sural and superficial peroneal sensory responses are within normal limits. Right peroneal and tibial motor responses are within normal limits. Right tibial H reflex study is within normal limits. There is no evidence of active or chronic motor axonal loss changes affecting any of the tested muscles.  Motor unit configuration and recruitment pattern is within normal limits. Impression: This is a normal study of the right lower extremity.  In particular, there is no evidence of a lumbosacral radiculopathy or large fiber sensorimotor polyneuropathy.  11/10/22 - R tib/fib x-ray: FINDINGS: There is no evidence of fracture or other focal bone lesions. Soft tissues are unremarkable.   IMPRESSION: Negative.  PATIENT SURVEYS:  Modified Oswestry 14 /  50 = 28.0 %  LEFS 51 / 80 = 63.7 %  SCREENING FOR RED FLAGS: Bowel or bladder incontinence: No Spinal tumors: No Cauda equina syndrome: No Compression fracture: No Abdominal aneurysm: No  COGNITION:  Overall cognitive status: Within functional limits for tasks assessed    SENSATION: WFL Reports prior numbness and tingling but none recently  EDEMA: Mild R ankle and distal LE edema  MUSCLE LENGTH: Hamstrings: mild/mod tight R>L ITB: mild tight R Piriformis: mod tight R Hip flexors: mild tight B Quads: mild tight B Heelcord: mild/mod tight R>L  POSTURE:  B genu valgum and B genu recurvatum  PALPATION: Increased muscle tension and TTP with multiple taut bands and TPs over R anterior tibialis, peroneals, medial/lateral gastroc, and L glutes.  LUMBAR ROM:   Active  AROM  eval  Flexion Hands to ankles *  Extension 50% limited *  Right lateral flexion WFL  Left lateral flexion WFL *  Right rotation WFL  Left rotation WFL *  (Blank rows = not tested, * = pain)  LOWER EXTREMITY ROM:     Active  Right eval Left eval  Hip flexion    Hip extension    Hip abduction    Hip adduction    Hip internal rotation    Hip external rotation    Knee flexion 106 139  Knee extension +2 -2 hyperextension  Ankle dorsiflexion 1 6  Ankle plantarflexion 50 54  Ankle inversion 38 38  Ankle eversion 10 18  (Blank rows = not tested)  LOWER EXTREMITY MMT:    MMT Right eval Left eval  Hip flexion 4 4+  Hip extension 4 4  Hip abduction 4 4  Hip adduction 4- 4-  Hip internal rotation 4+ 5  Hip external rotation 3+ 4-  Knee flexion 4 5  Knee extension 4 4+  Ankle dorsiflexion 4- * 4+  Ankle plantarflexion 4- (7 SLS HR) 5  Ankle inversion 4- 5  Ankle eversion 4- 5   (Blank rows = not tested, * = pain)  LUMBAR SPECIAL TESTS:  Straight leg raise test: Negative   TODAY'S TREATMENT:  01/07/23 THERAPEUTIC EXERCISE: Instruction in initial HEP to improve flexibility, strength  and mobility.  Verbal and tactile cues throughout for technique.  Nustep [id number] Prone on elbows 3 x 1 min Prone HS curls x 20 bil Prone hip extension x 10 bil Prone shoulder flexion x 10 bil  Provided instruction on self STM with lacrosse ball 01/02/23 THERAPEUTIC EXERCISE: Instruction in initial HEP to improve flexibility, strength and mobility.  Verbal and tactile cues throughout for technique.  Hooklying R KTOS piriformis stretch x 30" - pt not feeling much stretch Hooklying R figure-4 piriformis stretch with overpressure x 30" Hooklying R piriformis stretch pulling heel to hip x 30"  - 2 x daily - 7 x weekly - 3 reps - 30 sec hold Long-sitting R gastroc stretch with strap x 30" Long-sitting R hip hinge HS stretch with strap x 30"   Long-sitting R soleus stretch with strap x 30"  THERAPEUTIC ACTIVITIES:  Provided instruction in self manual TrP release. Provided instruction in self-STM using foam roller to R tib anterior, peroneals and gastroc/soleus.   PATIENT EDUCATION:  Education details: PT eval findings, anticipated POC, initial HEP, self-STM techniques to R tib anterior, peroneals and gastroc/soleus using foam roller, and role of DN Person educated: Patient Education method: Explanation, Demonstration, Verbal cues, Handouts, and MRolling Hill[place]ode texted to patient Education comprehension: verbalized understanding, returned demonstration, and needs further education  HOME EXERCISE PROGRAM: Access Code: [email or web address]: [email or web address] Date: 01/07/2023 Prepared by: BClarene Essex Exercises - Supine Piriformis Stretch Pulling Heel to Hip  - 2 x daily - 7 x weekly - 3 reps - 30 sec hold - Supine Figure 4 Piriformis Stretch  - 2 x daily - 7 x weekly - 3 reps - 30 sec hold - Seated Table Hamstring Stretch (Mirrored)  - 2 x daily - 7 x weekly - 3 reps - 30 sec hold - Long Sitting Calf Stretch with Strap  - 2 x daily - 7 x weekly - 3 reps - 30 sec hold - Long  Sitting Soleus Stretch on Bolster with Strap  - 2 x daily - 7 x weekly - 3 reps - 30 sec hold - Peroneal stretch  - 2 x daily - 7 x weekly - 3 reps - 30 sec hold - Calf Mobilization with Foam Roll  - 1 x daily - 7 x weekly - 2 sets - 10 reps - 3 sec hold - Prone Press Up On Elbows  - 1 x daily - 7 x weekly - 3 sets - 1 min hold - Prone Knee Flexion  - 1 x daily - 7 x weekly - 3 sets - 10 reps - Prone Hip Extension  - 1 x daily - 7 x weekly - 2 sets - 10 reps - Prone Single Shoulder Flexion  - 1 x daily - 7 x weekly - 2 sets - 10 reps  Patient Education - Trigger Point Dry Needling   ASSESSMENT:  CLINICAL IMPRESSION: Pt had reports of continued pain in her R low back. We attempted exercises in supine but this caused lower back pain, she was more tolerant of prone exercises. Prone on elbows gave her the most relief. She does have other factors including stress from recent death in family which is most likely contributing to pain.  Provided instruction on self STM with lacrosse ball as well to reduce muscle tension. Pt responded well to treatment.   OBJECTIVE IMPAIRMENTS: Abnormal gait, decreased activity tolerance, decreased balance, decreased endurance, decreased mobility, difficulty walking, decreased ROM, decreased strength, hypomobility, increased edema, increased fascial restrictions, impaired perceived functional ability, increased muscle spasms, impaired flexibility, improper body mechanics, postural dysfunction, and pain.   ACTIVITY LIMITATIONS: carrying, lifting, bending, sitting, standing, squatting, sleeping, stairs, transfers, locomotion level, and caring for others  PARTICIPATION LIMITATIONS: meal prep, cleaning, laundry, interpersonal relationship, community activity, and occupation  PERSONAL FACTORS: Fitness, Past/current experiences, Profession, Time since onset of injury/illness/exacerbation, and 3+ comorbidities: R knee - lateral meniscus tear; OA - R knee; chronic pain R knee;  GERD; thyroid disease; obesity  are also affecting patient's functional outcome.   REHAB POTENTIAL: Good  CLINICAL DECISION MAKING: Evolving/moderate complexity  EVALUATION COMPLEXITY: Moderate   GOALS: Goals reviewed with patient? Yes  SHORT TERM GOALS: Target date: 01/23/2023   Patient will be independent with initial HEP to improve outcomes and carryover.  Baseline: Initial HEP provided on eval Goal status: INITIAL  2.  Patient to demonstrate ability to achieve and maintain good spinal alignment/posturing and body mechanics needed for daily activities. Baseline:  Goal status: INITIAL   LONG TERM GOALS: Target date: 02/13/2023  Patient will be independent with ongoing/advanced HEP for self-management at home.  Baseline:  Goal status: INITIAL  2.  Patient will report 50-75% improvement in low back pain and R LE pain to improve QOL.  Baseline: 4/10 Goal status: INITIAL  3.  Patient will demonstrate full pain free lumbar ROM to perform ADLs. Baseline: Refer to above lumbar ROM table Goal status: INITIAL  4.  Patient to improve R knee and ankle AROM to [facility] without pain provocation.   Baseline: Refer to above LE ROM table Goal status: INITIAL  5.  Patient will demonstrate improved B LE strength to >/= 4+/5 for improved stability and ease of mobility . Baseline: Refer to above MMT table Goal status: INITIAL  6.  Patient will report >/= 60/80 on LEFS to demonstrate improved functional ability with decreased R LE pain interference.  Baseline: 51 / 80 = 63.7 % Goal status: INITIAL   7. Patient will report </= 16% (8/50) on Modified Oswestry to demonstrate improved functional ability with decreased low back pain interference. Baseline: 14 / 50 = 28.0 % Goal status: INITIAL  8.  Patient will tolerate 20-30 min of sitting, standing or walking w/o increased pain to allow for  improved mobility and activity tolerance for job performance. 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 Goal status: INITIAL   PLAN:  PT FREQUENCY: 1-2x/week - 2x/wk initially, potentially tapering to 1/xwk with increased emphasis on HEP  PT DURATION: 6 weeks  PLANNED INTERVENTIONS: Therapeutic exercises, Therapeutic activity, Neuromuscular re-education, Balance training, Gait training, Patient/Family education, Self Care, Joint mobilization, Stair training, Aquatic Therapy, Dry Needling, Spinal mobilization, Cryotherapy, Moist heat, Taping, Ultrasound, Manual therapy, and Re-evaluation  PLAN FOR NEXT SESSION: Review initial HEP; progress lumbar and LE flexibility and stretching; initiate lumbopelvic strengthening and stabilization as well as LE strengthening; MT +/- DN to low back, right glutes and LE as indicated   Artist Pais, PTA 01/07/2023, 10:44 AM

## 2023-01-09 ENCOUNTER — Encounter: Payer: Self-pay | Admitting: Physical Therapy

## 2023-01-09 ENCOUNTER — Ambulatory Visit: Payer: Medicaid Other | Admitting: Physical Therapy

## 2023-01-09 DIAGNOSIS — M62838 Other muscle spasm: Secondary | ICD-10-CM

## 2023-01-09 DIAGNOSIS — M6281 Muscle weakness (generalized): Secondary | ICD-10-CM | POA: Diagnosis not present

## 2023-01-09 DIAGNOSIS — M5416 Radiculopathy, lumbar region: Secondary | ICD-10-CM | POA: Diagnosis not present

## 2023-01-09 DIAGNOSIS — M79604 Pain in right leg: Secondary | ICD-10-CM

## 2023-01-09 DIAGNOSIS — M5459 Other low back pain: Secondary | ICD-10-CM

## 2023-01-09 NOTE — Therapy (Addendum)
OUTPATIENT PHYSICAL THERAPY TREATMENT / DISCHARGE SUMMARY   Patient Name: Rebecca Watson MRN: [id number] DOB:03-17-1981, 42 y.o., female Today's Date: 01/09/2023  END OF SESSION:  PT End of Session - 01/09/23 1016     Visit Number 3    Date for PT Re-Evaluation 02/13/23    Authorization Type [place] Medicaid    Authorization Time Period 01/04/23- 03/04/23    Authorization - Visit Number 2    Authorization - Number of Visits 6    PT Start Time 1016    PT Stop Time 1102    PT Time Calculation (min) 46 min    Activity Tolerance Patient tolerated treatment well    Behavior During Therapy WFL for tasks assessed/performed              Past Medical History:  Diagnosis Date   Allergy    Anemia    low FE per pt    Constipation    Diverticulitis    Diverticulosis    Dry eyes    GERD (gastroesophageal reflux disease)    Obesity    Thyroid disease    Trouble in sleeping    Past Surgical History:  Procedure Laterality Date   CESAREAN SECTION     with twins  x 1    IUD REMOVAL     broken IUD removed    TUBAL LIGATION     x both    Patient Active Problem List   Diagnosis Date Noted   Lumbar radiculopathy 11/14/2022   Vaginal discharge 10/12/2020   Anxiety and depression 10/12/2020   IUD (intrauterine device) in place 10/12/2020   Complex tear of lateral meniscus of right knee as current injury 08/04/2018   Unilateral primary osteoarthritis, right knee 08/04/2018   Chronic pain of right knee 08/04/2018   Endometriosis determined by laparoscopy 12/05/2017    PCP: Pcp, No   REFERRING PROVIDER: Myra Rude, MD    REFERRING DIAG: M54.16 (ICD-10-CM) - Lumbar radiculopathy  THERAPY DIAG:  Other low back pain  Pain in right leg  Muscle weakness (generalized)  Other muscle spasm  RATIONALE FOR EVALUATION AND TREATMENT: Rehabilitation  ONSET DATE: July 2023  NEXT MD VISIT:  None scheduled   SUBJECTIVE:                                                                                                                                                                                                          SUBJECTIVE STATEMENT: Pt reports reports she paid extra for the massage chair at the nail salon yesterday  and has had only minimal pain in her back since this.   PAIN: Are you having pain? Yes: NPRS scale: 2-3/10 Pain location: L low back & L lower leg Pain description: throbbing Aggravating factors: prolonged standing, bending down too fast, twisting Relieving factors: gabapentin & Tylenol  Are you having pain? No - none in R shin  PERTINENT HISTORY:  R knee - lateral meniscus tear; OA - R knee; chronic pain R knee; GERD; thyroid disease; obesity  PRECAUTIONS: None  WEIGHT BEARING RESTRICTIONS: No  FALLS:  Has patient fallen in last 6 months? No  LIVING ENVIRONMENT: Lives with: lives alone Lives in: House/apartment Stairs: No Has following equipment at home: None  OCCUPATION: Nurse - FT in long-term care  PLOF: Independent and Leisure: internet yoga, Zumba classes, paces a lot   PATIENT GOALS: "To figure out what I can do at home."   OBJECTIVE: (objective measures completed at initial evaluation unless otherwise dated)  DIAGNOSTIC FINDINGS:  12/06/22 - NCV & EMG Findings: Extensive electrodiagnostic testing of the right lower extremity shows: Right sural and superficial peroneal sensory responses are within normal limits. Right peroneal and tibial motor responses are within normal limits. Right tibial H reflex study is within normal limits. There is no evidence of active or chronic motor axonal loss changes affecting any of the tested muscles.  Motor unit configuration and recruitment pattern is within normal limits. Impression: This is a normal study of the right lower extremity.  In particular, there is no evidence of a lumbosacral radiculopathy or large fiber sensorimotor polyneuropathy.  11/10/22 - R tib/fib  x-ray: FINDINGS: There is no evidence of fracture or other focal bone lesions. Soft tissues are unremarkable.   IMPRESSION: Negative.  PATIENT SURVEYS:  Modified Oswestry 14 / 50 = 28.0 %  LEFS 51 / 80 = 63.7 %  SCREENING FOR RED FLAGS: Bowel or bladder incontinence: No Spinal tumors: No Cauda equina syndrome: No Compression fracture: No Abdominal aneurysm: No  COGNITION:  Overall cognitive status: Within functional limits for tasks assessed    SENSATION: WFL Reports prior numbness and tingling but none recently  EDEMA: Mild R ankle and distal LE edema  MUSCLE LENGTH: Hamstrings: mild/mod tight R>L ITB: mild tight R Piriformis: mod tight R Hip flexors: mild tight B Quads: mild tight B Heelcord: mild/mod tight R>L  POSTURE:  B genu valgum and B genu recurvatum  PALPATION: Increased muscle tension and TTP with multiple taut bands and TPs over R anterior tibialis, peroneals, medial/lateral gastroc, and L glutes.  LUMBAR ROM:   Active  AROM  eval  Flexion Hands to ankles *  Extension 50% limited *  Right lateral flexion WFL  Left lateral flexion WFL *  Right rotation WFL  Left rotation WFL *  (Blank rows = not tested, * = pain)  LOWER EXTREMITY ROM:     Active  Right eval Left eval  Hip flexion    Hip extension    Hip abduction    Hip adduction    Hip internal rotation    Hip external rotation    Knee flexion 106 139  Knee extension +2 -2 hyperextension  Ankle dorsiflexion 1 6  Ankle plantarflexion 50 54  Ankle inversion 38 38  Ankle eversion 10 18  (Blank rows = not tested)  LOWER EXTREMITY MMT:    MMT Right eval Left eval  Hip flexion 4 4+  Hip extension 4 4  Hip abduction 4 4  Hip adduction 4- 4-  Hip  internal rotation 4+ 5  Hip external rotation 3+ 4-  Knee flexion 4 5  Knee extension 4 4+  Ankle dorsiflexion 4- * 4+  Ankle plantarflexion 4- (7 SLS HR) 5  Ankle inversion 4- 5  Ankle eversion 4- 5   (Blank rows = not tested, * =  pain)  LUMBAR SPECIAL TESTS:  Straight leg raise test: Negative   TODAY'S TREATMENT:   01/09/23 THERAPEUTIC EXERCISE: to improve flexibility, strength and mobility.  Verbal and tactile cues throughout for technique.  TM 1.4 mph x 6 min TrA isometric in hooklying 10 x 5" - all hooklying exercises with towel under lumbar spine Sciatic nerve glide in hooklying 2 x 10 Hooklying 90/90 knee and ankle flexion/extension 2 x 10 Hooklying 3-way R HS + gastroc stretch with strap (toes straight, turned in & out x 30 sec each) TrA + GTB bent-knee fallout 10 x 3"  MANUAL THERAPY: To promote normalized muscle tension, improved flexibility, and reduced pain.  STM and manual TPR to R anterior tibialis   01/07/23 THERAPEUTIC EXERCISE: Instruction in initial HEP to improve flexibility, strength and mobility.  Verbal and tactile cues throughout for technique.  Nustep L3x36min Prone on elbows 3 x 1 min Prone HS curls x 20 bil Prone hip extension x 10 bil Prone shoulder flexion x 10 bil  Provided instruction on self STM with lacrosse ball   01/02/23 THERAPEUTIC EXERCISE: Instruction in initial HEP to improve flexibility, strength and mobility.  Verbal and tactile cues throughout for technique.  Hooklying R KTOS piriformis stretch x 30" - pt not feeling much stretch Hooklying R figure-4 piriformis stretch with overpressure x 30" Hooklying R piriformis stretch pulling heel to hip x 30"  - 2 x daily - 7 x weekly - 3 reps - 30 sec hold Long-sitting R gastroc stretch with strap x 30" Long-sitting R hip hinge HS stretch with strap x 30"   Long-sitting R soleus stretch with strap x 30"  THERAPEUTIC ACTIVITIES:  Provided instruction in self manual TrP release. Provided instruction in self-STM using foam roller to R tib anterior, peroneals and gastroc/soleus.   PATIENT EDUCATION:  Education details: HEP update, self-STM techniques to R tib anterior, peroneals and gastroc/soleus using foam roller, and role  of DN Person educated: Patient Education method: Explanation, Demonstration, Verbal cues, and MedBridgeGO code texted to patient Education comprehension: verbalized understanding, returned demonstration, and needs further education  HOME EXERCISE PROGRAM: Access Code: [email or web address] URL: [email or web address] Date: 01/09/2023 Prepared by: Glenetta Hew  Exercises - Supine Piriformis Stretch Pulling Heel to Hip  - 2 x daily - 7 x weekly - 3 reps - 30 sec hold - Supine Figure 4 Piriformis Stretch  - 2 x daily - 7 x weekly - 3 reps - 30 sec hold - Seated Table Hamstring Stretch (Mirrored)  - 2 x daily - 7 x weekly - 3 reps - 30 sec hold - Long Sitting Calf Stretch with Strap  - 2 x daily - 7 x weekly - 3 reps - 30 sec hold - Long Sitting Soleus Stretch on Bolster with Strap  - 2 x daily - 7 x weekly - 3 reps - 30 sec hold - Peroneal stretch  - 2 x daily - 7 x weekly - 3 reps - 30 sec hold - Calf Mobilization with Foam Roll  - 1 x daily - 7 x weekly - 2 sets - 10 reps - 3 sec hold - Prone Press Up On Elbows  - 1 x  daily - 7 x weekly - 3 sets - 1 min hold - Prone Knee Flexion  - 1 x daily - 7 x weekly - 3 sets - 10 reps - Prone Hip Extension  - 1 x daily - 7 x weekly - 2 sets - 10 reps - Prone Single Shoulder Flexion  - 1 x daily - 7 x weekly - 2 sets - 10 reps - Hooklying Hamstring Stretch with Strap  - 1 x daily - 7 x weekly - 3 reps - 30 sec hold - Supine Sciatic Nerve Glide  - 1 x daily - 7 x weekly - 2 sets - 10 reps - 3 sec hold - Supine 90/90 Knee Flexion/Extension  - 1 x daily - 7 x weekly - 2 sets - 10 reps - 3 sec hold - Hooklying Single Leg Bent Knee Fallouts with Resistance  - 1 x daily - 7 x weekly - 2 sets - 10 reps - 3 sec hold  Patient Education - Trigger Point Dry Needling   ASSESSMENT:  CLINICAL IMPRESSION: Rebecca Watson reports good relief of her back pain from use of a massage chair at her nail salon yesterday.  She notes better comfort when laying supine or  sleeping if she has a lumbar roll under her low back, however she notes increased right shin pain when in hooklying.  TPs identified in right anterior tibialis with mild improvement noted after MT but pain still present.  Introduced nerve glides in hooklying with patient uncertain of benefit.  Given limited tolerance for supine/hooklying, will attempt to continue to progress exercises in prone or sitting.   OBJECTIVE IMPAIRMENTS: Abnormal gait, decreased activity tolerance, decreased balance, decreased endurance, decreased mobility, difficulty walking, decreased ROM, decreased strength, hypomobility, increased edema, increased fascial restrictions, impaired perceived functional ability, increased muscle spasms, impaired flexibility, improper body mechanics, postural dysfunction, and pain.   ACTIVITY LIMITATIONS: carrying, lifting, bending, sitting, standing, squatting, sleeping, stairs, transfers, locomotion level, and caring for others  PARTICIPATION LIMITATIONS: meal prep, cleaning, laundry, interpersonal relationship, community activity, and occupation  PERSONAL FACTORS: Fitness, Past/current experiences, Profession, Time since onset of injury/illness/exacerbation, and 3+ comorbidities: R knee - lateral meniscus tear; OA - R knee; chronic pain R knee; GERD; thyroid disease; obesity  are also affecting patient's functional outcome.   REHAB POTENTIAL: Good  CLINICAL DECISION MAKING: Evolving/moderate complexity  EVALUATION COMPLEXITY: Moderate   GOALS: Goals reviewed with patient? Yes  SHORT TERM GOALS: Target date: 01/23/2023   Patient will be independent with initial HEP to improve outcomes and carryover.  Baseline: Initial HEP provided on eval Goal status: IN PROGRESS  2.  Patient to demonstrate ability to achieve and maintain good spinal alignment/posturing and body mechanics needed for daily activities. Baseline:  Goal status: IN PROGRESS   LONG TERM GOALS: Target date:  02/13/2023  Patient will be independent with ongoing/advanced HEP for self-management at home.  Baseline:  Goal status: IN PROGRESS  2.  Patient will report 50-75% improvement in low back pain and R LE pain to improve QOL.  Baseline: 4/10 Goal status: IN PROGRESS  3.  Patient will demonstrate full pain free lumbar ROM to perform ADLs. Baseline: Refer to above lumbar ROM table Goal status: IN PROGRESS  4.  Patient to improve R knee and ankle AROM to [facility] without pain provocation.   Baseline: Refer to above LE ROM table Goal status: IN PROGRESS  5.  Patient will demonstrate improved B LE strength to >/= 4+/5 for improved stability and ease  of mobility . Baseline: Refer to above MMT table Goal status: IN PROGRESS  6.  Patient will report >/= 60/80 on LEFS to demonstrate improved functional ability with decreased R LE pain interference.  Baseline: 51 / 80 = 63.7 % Goal status: IN PROGRESS   7. Patient will report </= 16% (8/50) on Modified Oswestry to demonstrate improved functional ability with decreased low back pain interference. Baseline: 14 / 50 = 28.0 % Goal status: IN PROGRESS  8.  Patient will tolerate 20-30 min of sitting, standing or walking w/o increased pain to allow for  improved mobility and activity tolerance for job performance. 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 Goal status: IN PROGRESS   PLAN:  PT FREQUENCY: 1-2x/week - 2x/wk initially, potentially tapering to 1/xwk with increased emphasis on HEP  PT DURATION: 6 weeks  PLANNED INTERVENTIONS: Therapeutic exercises, Therapeutic activity, Neuromuscular re-education, Balance training, Gait training, Patient/Family education, Self Care, Joint mobilization, Stair training, Aquatic Therapy, Dry Needling, Spinal mobilization, Cryotherapy, Moist heat, Taping, Ultrasound, Manual therapy, and Re-evaluation  PLAN FOR NEXT SESSION: Review initial HEP;  progress lumbar and LE flexibility and stretching; initiate lumbopelvic strengthening and stabilization as well as LE strengthening (initial focus in prone and/or sitting due to limited tolerance for supine/hooklying); MT +/- DN to low back, right glutes and LE as indicated   Marry Guan, PT 01/09/2023, 1:11 PM   PHYSICAL THERAPY DISCHARGE SUMMARY  Visits from Start of Care: 3  Current functional level related to goals / functional outcomes:  Refer to above clinical impression and goal assessment for status as of last visit on 01/09/2023. As of 01/15/23, Rebecca Watson cancelled all of her remaining visits, stating she was starting orientation for a new job. She has not returned to PT in >30 days, therefore will proceed with discharge from PT for this episode.     Remaining deficits:  As above. Unable to formally assess status at discharge due to failure to return to PT.    Education / Equipment:  Initial HEP   Patient agrees to discharge. Patient goals were not met. Patient is being discharged due to not returning since the last visit.  Marry Guan, PT 04/04/23, 12:23 PM  [facility] [address]  [address] [place], [place], [address] Phone: [phone]   Fax:  [phone]

## 2023-01-14 ENCOUNTER — Encounter: Payer: Medicaid Other | Admitting: Physical Therapy

## 2023-01-21 ENCOUNTER — Encounter: Payer: Medicaid Other | Admitting: Physical Therapy

## 2023-02-05 NOTE — Progress Notes (Addendum)
Subjective:    Rebecca Watson - 42 y.o. female MRN [id number]  Date of birth: [birth year]  HPI  Rebecca Watson is to establish care.   Current issues and/or concerns: - Patient reports anxiety ongoing for years. Anxiety mostly related to work-life balance. States she talks to "anyone who will listen". She recalls being in the grocery store recently and talking to one of the customers whom she did not know. She does not want to begin counseling as of present. Reports she tried Lexapro in the past and felt ineffective. She also tried Hydroxyzine and cannot recall if it helped or not but would like to try again. She denies thoughts of self-harm, suicidal ideations, homicidal ideations. - Reports right leg pain ongoing for 2 to 3 years. States she is established with Physical Therapy. However, she does not have any upcoming appointments with them due to she recently began a new job and does not have any paid leave currently. Reports Physical Therapy explained to her that her back pain is secondary to her right leg pain. She is taking Gabapentin to help but would rather not continue due to side effect of drowsiness. She denies recent trauma/injury and associated red flag symptoms.  - No further issues/concerns for discussion today.    ROS per HPI     Health Maintenance:  Health Maintenance Due  Topic Date Due   COVID-19 Vaccine (3 - 2023-24 season) 06/29/2022     Past Medical History: Patient Active Problem List   Diagnosis Date Noted   Lumbar radiculopathy 11/14/2022   Vaginal discharge 10/12/2020   Anxiety and depression 10/12/2020   IUD (intrauterine device) in place 10/12/2020   Complex tear of lateral meniscus of right knee as current injury 08/04/2018   Unilateral primary osteoarthritis, right knee 08/04/2018   Chronic pain of right knee 08/04/2018   Endometriosis determined by laparoscopy 12/05/2017    Social History   reports that she has been smoking cigarettes. She  has been smoking an average of .5 packs per day. She has never used smokeless tobacco. She reports current alcohol use. She reports current drug use. Drug: Marijuana.   Family History  family history includes Alcoholism in her father; Bone cancer in her mother; COPD in her father; High Cholesterol in her father; High blood pressure in her father; Stroke in her father.   Medications: reviewed and updated   Objective:   Physical Exam BP 126/80 (BP Location: Left Arm, Patient Position: Sitting, Cuff Size: Large)   Pulse 88   Temp 98.1 F (36.7 C) (Oral)   Ht  (1.676 m)   Wt 227 lb (103 kg)   LMP 02/06/2023 (Exact Date)   SpO2 96%   BMI 36.64 kg/m   Physical Exam HENT:     Head: Normocephalic and atraumatic.  Eyes:     Extraocular Movements: Extraocular movements intact.     Conjunctiva/sclera: Conjunctivae normal.     Pupils: Pupils are equal, round, and reactive to light.  Cardiovascular:     Rate and Rhythm: Normal rate and regular rhythm.     Pulses: Normal pulses.     Heart sounds: Normal heart sounds.  Pulmonary:     Effort: Pulmonary effort is normal.     Breath sounds: Normal breath sounds.  Musculoskeletal:     Cervical back: Normal range of motion and neck supple.     Right hip: Normal.     Left hip: Normal.     Right upper leg: Normal.  Left upper leg: Normal.     Right knee: Tenderness present.     Left knee: Normal.     Right lower leg: Normal.     Left lower leg: Normal.     Right ankle: Normal.     Left ankle: Normal.     Right foot: Normal.     Left foot: Normal.  Neurological:     General: No focal deficit present.     Mental Status: She is alert and oriented to person, place, and time.  Psychiatric:        Mood and Affect: Affect is tearful.    Assessment & Plan:  1. Encounter to establish care - Patient presents today to establish care. During the interim follow-up with primary provider as scheduled.  - Return for annual physical  examination, labs, and health maintenance. Arrive fasting meaning having no food for at least 8 hours prior to appointment. You may have only water or black coffee. Please take scheduled medications as normal.  2. Anxiety and depression - Patient denies thoughts of self-harm, suicidal ideations, homicidal ideations. - Fluoxetine as prescribed. Counseled on medication adherence/adverse effects. - Patient declined referral to Psychiatry.  - Follow-up with primary provider in 4 weeks or sooner if needed.  - FLUoxetine (PROZAC) 20 MG capsule; Take 1 capsule (20 mg total) by mouth daily.  Dispense: 30 capsule; Refill: 0 - hydrOXYzine (VISTARIL) 25 MG capsule; Take 1 capsule (25 mg total) by mouth every 8 (eight) hours as needed.  Dispense: 30 capsule; Refill: 1  3. Chronic low back pain, unspecified back pain laterality, unspecified whether sciatica present 4. Chronic pain of right lower extremity - Meloxicam as prescribed. Counseled on medication adherence/adverse effects. - Keep all scheduled appointments with Physical Therapy.  - Referral to Orthopedics for further evaluation/management. During the interim follow-up with primary provider as scheduled.  - meloxicam (MOBIC) 7.5 MG tablet; Take 1 tablet (7.5 mg total) by mouth daily.  Dispense: 30 tablet; Refill: 1 - Ambulatory referral to Orthopedics    Patient was given clear instructions to go to Emergency Department or return to medical center if symptoms don't improve, worsen, or new problems develop.The patient verbalized understanding.  I discussed the assessment and treatment plan with the patient. The patient was provided an opportunity to ask questions and all were answered. The patient agreed with the plan and demonstrated an understanding of the instructions.   The patient was advised to call back or seek an in-person evaluation if the symptoms worsen or if the condition fails to improve as anticipated.    Ricky Stabs,  NP 02/12/2023, 1:54 PM Primary Care at [facility]

## 2023-02-11 ENCOUNTER — Encounter: Payer: Self-pay | Admitting: *Deleted

## 2023-02-12 ENCOUNTER — Other Ambulatory Visit (HOSPITAL_COMMUNITY): Payer: Self-pay

## 2023-02-12 ENCOUNTER — Encounter: Payer: Self-pay | Admitting: Family

## 2023-02-12 ENCOUNTER — Ambulatory Visit: Payer: Medicaid Other | Admitting: Family

## 2023-02-12 VITALS — BP 126/80 | HR 88 | Temp 98.1°F | Ht 66.0 in | Wt 227.0 lb

## 2023-02-12 DIAGNOSIS — M545 Low back pain, unspecified: Secondary | ICD-10-CM

## 2023-02-12 DIAGNOSIS — F32A Depression, unspecified: Secondary | ICD-10-CM | POA: Diagnosis not present

## 2023-02-12 DIAGNOSIS — G8929 Other chronic pain: Secondary | ICD-10-CM

## 2023-02-12 DIAGNOSIS — M79604 Pain in right leg: Secondary | ICD-10-CM | POA: Diagnosis not present

## 2023-02-12 DIAGNOSIS — Z7689 Persons encountering health services in other specified circumstances: Secondary | ICD-10-CM | POA: Diagnosis not present

## 2023-02-12 DIAGNOSIS — F419 Anxiety disorder, unspecified: Secondary | ICD-10-CM | POA: Diagnosis not present

## 2023-02-12 MED ORDER — MELOXICAM 7.5 MG PO TABS
7.5000 mg | ORAL_TABLET | Freq: Every day | ORAL | 1 refills | Status: DC
  Filled 2023-02-12: qty 30, 30d supply, fill #0

## 2023-02-12 MED ORDER — HYDROXYZINE PAMOATE 25 MG PO CAPS
25.0000 mg | ORAL_CAPSULE | Freq: Three times a day (TID) | ORAL | 1 refills | Status: DC | PRN
  Filled 2023-02-12: qty 30, 10d supply, fill #0

## 2023-02-12 MED ORDER — FLUOXETINE HCL 20 MG PO CAPS
20.0000 mg | ORAL_CAPSULE | Freq: Every day | ORAL | 0 refills | Status: DC
  Filled 2023-02-12: qty 30, 30d supply, fill #0

## 2023-02-12 NOTE — Patient Instructions (Signed)
Thank you for choosing Primary Care at [facility] for your medical home!    Rebecca Watson was seen by Rema Fendt, NP today.   Rebecca Watson's primary care provider is Rema Fendt, NP.   For the best care possible,  you should try to see Ricky Stabs, NP whenever you come to office.   We look forward to seeing you again soon!  If you have any questions about your visit today,  please call us at [phone]  Or feel free to reach your provider via MyChart.   Keeping you healthy   Get these tests Blood pressure- Have your blood pressure checked once a year by your healthcare provider.  Normal blood pressure is 120/80. Weight- Have your body mass index (BMI) calculated to screen for obesity.  BMI is a measure of body fat based on height and weight. You can also calculate your own BMI at https://www.west-esparza.com/. Cholesterol- Have your cholesterol checked regularly starting at age 43, sooner may be necessary if you have diabetes, high blood pressure, if a family member developed heart diseases at an early age or if you smoke.  Chlamydia, HIV, and other sexual transmitted disease- Get screened each year until the age of 43 then within three months of each new sexual partner. Diabetes- Have your blood sugar checked regularly if you have high blood pressure, high cholesterol, a family history of diabetes or if you are overweight.   Get these vaccines Flu shot- Every fall. Tetanus shot- Every 10 years. Menactra- Single dose; prevents meningitis.   Take these steps Don't smoke- If you do smoke, ask your healthcare provider about quitting. For tips on how to quit, go to www.smokefree.gov or call 1-800-QUIT-NOW. Be physically active- Exercise 5 days a week for at least 30 minutes.  If you are not already physically active start slow and gradually work up to 30 minutes of moderate physical activity.  Examples of moderate activity include walking briskly, mowing the yard,  dancing, swimming bicycling, etc. Eat a healthy diet- Eat a variety of healthy foods such as fruits, vegetables, low fat milk, low fat cheese, yogurt, lean meats, poultry, fish, beans, tofu, etc.  For more information on healthy eating, go to www.thenutritionsource.org Drink alcohol in moderation- Limit alcohol intake two drinks or less a day.  Never drink and drive. Dentist- Brush and floss teeth twice daily; visit your dentis twice a year. Depression-Your emotional health is as important as your physical health.  If you're feeling down, losing interest in things you normally enjoy please talk with your healthcare provider. Gun Safety- If you keep a gun in your home, keep it unloaded and with the safety lock on.  Bullets should be stored separately. Helmet use- Always wear a helmet when riding a motorcycle, bicycle, rollerblading or skateboarding. Safe sex- If you may be exposed to a sexually transmitted infection, use a condom Seat belts- Seat bels can save your life; always wear one. Smoke/Carbon Monoxide detectors- These detectors need to be installed on the appropriate level of your home.  Replace batteries at least once a year. Skin Cancer- When out in the sun, cover up and use sunscreen SPF 15 or higher. Violence- If anyone is threatening or hurting you, please tell your healthcare provider.

## 2023-02-12 NOTE — Progress Notes (Signed)
Est care / new pt.  Anxiety concerns - pt would like to discuss poss meds  Intermittent pain on lower back radiating to R leg X2-3 yrs

## 2023-03-04 ENCOUNTER — Ambulatory Visit (INDEPENDENT_AMBULATORY_CARE_PROVIDER_SITE_OTHER): Payer: Medicaid Other | Admitting: Physician Assistant

## 2023-03-04 ENCOUNTER — Other Ambulatory Visit (INDEPENDENT_AMBULATORY_CARE_PROVIDER_SITE_OTHER): Payer: Medicaid Other

## 2023-03-04 ENCOUNTER — Encounter: Payer: Self-pay | Admitting: Physician Assistant

## 2023-03-04 DIAGNOSIS — M5410 Radiculopathy, site unspecified: Secondary | ICD-10-CM | POA: Diagnosis not present

## 2023-03-04 NOTE — Progress Notes (Signed)
Office Visit Note   Patient: Rebecca Watson           Date of Birth: August 19, 1981           MRN: [id number] Visit Date: 03/04/2023              Requested by: Rema Fendt, NP [address] [place],  [place] [address] PCP: Rema Fendt, NP   Assessment & Plan: Visit Diagnoses:  1. Radicular pain of lower extremity     Plan: Given the fact the patient is failed conservative treatment which is included medications, time, physical therapy and EMG nerve conduction studies without any relief recommend MRI.  MRI of the lumbar spine to rule out HNP as a source of her radicular symptoms down the right leg.  Have her follow-up after the MRI to go over results and discuss further treatment.  Questions were encouraged and answered at length.  Advised her to continue home exercise program as taught by therapy.  Follow-Up Instructions: Return for After MRI.   Orders:  Orders Placed This Encounter  Procedures   XR Lumbar Spine 2-3 Views   MR Lumbar Spine w/o contrast   No orders of the defined types were placed in this encounter.     Procedures: No procedures performed   Clinical Data: No additional findings.   Subjective: Chief Complaint  Patient presents with   Right Leg - Pain    HPI Rebecca Watson is a 42 year old female comes in today for burning throbbing pain down the right leg that is been ongoing for a year.  She has been seen at [facility].  States she had therapy for her back and lower legs.  She continues to do the stretching.  However continues to have pain down her right leg.  Some back pain.  Denies any waking back pain but does state that throbbing and burning in the right leg causes her to awaken.  No bowel or bladder dysfunction.  No saddle anesthesia.  No change in overall weight.  No fevers or chills.  Medications that she is trying to include gabapentin and meloxicam along with Tylenol.  She states that taking gabapentin 300 mg 3 times daily caused her to  be sluggish.  She is cut back to just taking it at night with meloxicam.  States that the leg pain still awakens her at night on this regiment.  She works as a Engineer, civil (consulting).  Review of Systems See HPI otherwise negative or noncontributory.  Objective: Vital Signs: LMP 02/06/2023 (Exact Date)   Physical Exam Constitutional:      Appearance: She is not ill-appearing or diaphoretic.  Cardiovascular:     Pulses: Normal pulses.  Pulmonary:     Effort: Pulmonary effort is normal.  Neurological:     Mental Status: She is alert and oriented to person, place, and time.  Psychiatric:        Mood and Affect: Mood normal.     Ortho Exam Lower extremities 5 out of 5 strength throughout the lower extremities against resistance except for right great toe extension against resistance 4 out of 5 compared to 5 out of 5 on the left.  Straight leg raise is negative bilaterally.  Good range of motion of bilateral hips without pain.  Nontender over the trochanteric region both hips.  Full flexion extension lumbar spine without pain.  Tenderness over the lower lumbar spinal column in the lower right paraspinous region of the lumbar spine. Specialty Comments:  No specialty comments available.  Imaging: XR Lumbar Spine 2-3 Views  Result Date: 03/04/2023 Lumbar spine 2 views:  Normal disc spacing throughout the lumbar spine.  No spinal listhesis.  No acute fractures or findings.  Slight loss of lordotic curvature.    PMFS History: Patient Active Problem List   Diagnosis Date Noted   Lumbar radiculopathy 11/14/2022   Vaginal discharge 10/12/2020   Anxiety and depression 10/12/2020   IUD (intrauterine device) in place 10/12/2020   Complex tear of lateral meniscus of right knee as current injury 08/04/2018   Unilateral primary osteoarthritis, right knee 08/04/2018   Chronic pain of right knee 08/04/2018   Endometriosis determined by laparoscopy 12/05/2017   Past Medical History:  Diagnosis Date   Allergy     Anemia    low FE per pt    Constipation    Diverticulitis    Diverticulosis    Dry eyes    GERD (gastroesophageal reflux disease)    Obesity    Thyroid disease    Trouble in sleeping     Family History  Problem Relation Age of Onset   Bone cancer Mother        deceased age 9    Stroke Father    High blood pressure Father    COPD Father    Alcoholism Father    High Cholesterol Father    Colon cancer Neg Hx    Esophageal cancer Neg Hx    Colon polyps Neg Hx    Rectal cancer Neg Hx    Stomach cancer Neg Hx     Past Surgical History:  Procedure Laterality Date   CESAREAN SECTION     with twins  x 1    IUD REMOVAL     broken IUD removed    TUBAL LIGATION     x both    Social History   Occupational History   Not on file  Tobacco Use   Smoking status: Every Day    Packs/day: .5    Types: Cigarettes   Smokeless tobacco: Never  Vaping Use   Vaping Use: Some days   Start date: 10/29/2022  Substance and Sexual Activity   Alcohol use: Yes    Comment: ocassionally maybe 1   Drug use: Yes    Types: Marijuana    Comment: weaning off - smokes some not daily. Smoke marijuana this am. 12-14-20 uses THC gumies at night   Sexual activity: Yes    Birth control/protection: None    Comment: IUD taken on Nov 2023

## 2023-03-13 NOTE — Progress Notes (Deleted)
Patient ID: Rebecca Watson, female    DOB: 04-20-1981  MRN: [id number]  CC: Annual Physical Exam  Subjective: Rebecca Watson is a 42 y.o. female who presents for annual physical exam.   Her concerns today include:  Anxiety depression - Fluoxetine Ortho - back pain, Meloxicam  Patient Active Problem List   Diagnosis Date Noted   Lumbar radiculopathy 11/14/2022   Vaginal discharge 10/12/2020   Anxiety and depression 10/12/2020   IUD (intrauterine device) in place 10/12/2020   Complex tear of lateral meniscus of right knee as current injury 08/04/2018   Unilateral primary osteoarthritis, right knee 08/04/2018   Chronic pain of right knee 08/04/2018   Endometriosis determined by laparoscopy 12/05/2017     Current Outpatient Medications on File Prior to Visit  Medication Sig Dispense Refill   acetaminophen (TYLENOL) 500 MG tablet Take 1,000 mg by mouth every 6 (six) hours as needed for moderate pain or headache.     FLUoxetine (PROZAC) 20 MG capsule Take 1 capsule (20 mg total) by mouth daily. 30 capsule 0   gabapentin (NEURONTIN) 300 MG capsule Take 1 capsule (300 mg total) by mouth 3 (three) times daily. 90 capsule 1   hydrOXYzine (VISTARIL) 25 MG capsule Take 1 capsule (25 mg total) by mouth every 8 (eight) hours as needed. 30 capsule 1   meloxicam (MOBIC) 7.5 MG tablet Take 1 tablet (7.5 mg total) by mouth daily. 30 tablet 1   No current facility-administered medications on file prior to visit.    Allergies  Allergen Reactions   Flagyl [Metronidazole] Nausea And Vomiting    Vomiting     Social History   Socioeconomic History   Marital status: Single    Spouse name: Not on file   Number of children: 2   Years of education: Not on file   Highest education level: Not on file  Occupational History   Not on file  Tobacco Use   Smoking status: Every Day    Packs/day: .5    Types: Cigarettes   Smokeless tobacco: Never  Vaping Use   Vaping Use: Some days    Start date: 10/29/2022  Substance and Sexual Activity   Alcohol use: Yes    Comment: ocassionally maybe 1   Drug use: Yes    Types: Marijuana    Comment: weaning off - smokes some not daily. Smoke marijuana this am. 12-14-20 uses THC gumies at night   Sexual activity: Yes    Birth control/protection: None    Comment: IUD taken on Nov 2023  Other Topics Concern   Not on file  Social History Narrative   Not on file   Social Determinants of Health   Financial Resource Strain: Not on file  Food Insecurity: No Food Insecurity (10/12/2020)   Hunger Vital Sign    Worried About Running Out of Food in the Last Year: Never true    Ran Out of Food in the Last Year: Never true  Transportation Needs: No Transportation Needs (10/12/2020)   PRAPARE - Administrator, Civil Service (Medical): No    Lack of Transportation (Non-Medical): No  Recent Concern: Transportation Needs - Unmet Transportation Needs (09/14/2020)   PRAPARE - Administrator, Civil Service (Medical): Yes    Lack of Transportation (Non-Medical): Yes  Physical Activity: Not on file  Stress: Not on file  Social Connections: Not on file  Intimate Partner Violence: Not on file    Family History  Problem  Relation Age of Onset   Bone cancer Mother        deceased age 44    Stroke Father    High blood pressure Father    COPD Father    Alcoholism Father    High Cholesterol Father    Colon cancer Neg Hx    Esophageal cancer Neg Hx    Colon polyps Neg Hx    Rectal cancer Neg Hx    Stomach cancer Neg Hx     Past Surgical History:  Procedure Laterality Date   CESAREAN SECTION     with twins  x 1    IUD REMOVAL     broken IUD removed    TUBAL LIGATION     x both     ROS: Review of Systems Negative except as stated above  PHYSICAL EXAM: LMP 02/06/2023 (Exact Date)   Physical Exam  {female adult master:310786} {female adult master:310785}     Latest Ref Rng & Units 03/08/2021   11:33 AM  03/17/2020   10:16 AM 10/15/2019    4:37 PM  CMP  Glucose 70 - 99 mg/dL 62  78  77   BUN 6 - 23 mg/dL 7  10  11    Creatinine 0.40 - 1.20 mg/dL 1.61  0.96  0.45   Sodium 135 - 145 mEq/L 139  138  138   Potassium 3.5 - 5.1 mEq/L 3.9  4.0  3.9   Chloride 96 - 112 mEq/L 105  105  105   CO2 19 - 32 mEq/L 25  25  25    Calcium 8.4 - 10.5 mg/dL 9.2  9.2  9.2   Total Protein 6.0 - 8.3 g/dL  6.9    Total Bilirubin 0.2 - 1.2 mg/dL  0.4    Alkaline Phos 39 - 117 U/L  67    AST 0 - 37 U/L  12    ALT 0 - 35 U/L  7     Lipid Panel     Component Value Date/Time   CHOL 193 10/15/2019 1637   TRIG 55.0 10/15/2019 1637   HDL 70.80 10/15/2019 1637   CHOLHDL 3 10/15/2019 1637   VLDL 11.0 10/15/2019 1637   LDLCALC 112 (H) 10/15/2019 1637    CBC    Component Value Date/Time   WBC 6.1 03/08/2021 1133   RBC 4.98 03/08/2021 1133   HGB 12.3 03/08/2021 1133   HCT 38.4 03/08/2021 1133   PLT 256.0 03/08/2021 1133   MCV 77.2 (L) 03/08/2021 1133   MCH 26.0 10/03/2016 2206   MCHC 32.1 03/08/2021 1133   RDW 18.8 (H) 03/08/2021 1133   LYMPHSABS 2.4 03/08/2021 1133   MONOABS 0.6 03/08/2021 1133   EOSABS 0.1 03/08/2021 1133   BASOSABS 0.1 03/08/2021 1133    ASSESSMENT AND PLAN:  There are no diagnoses linked to this encounter.   Patient was given the opportunity to ask questions.  Patient verbalized understanding of the plan and was able to repeat key elements of the plan. Patient was given clear instructions to go to Emergency Department or return to medical center if symptoms don't improve, worsen, or new problems develop.The patient verbalized understanding.   No orders of the defined types were placed in this encounter.    Requested Prescriptions    No prescriptions requested or ordered in this encounter    No follow-ups on file.  Rema Fendt, NP

## 2023-03-13 NOTE — Progress Notes (Unsigned)
Patient ID: Rebecca Watson, female    DOB: 04/07/1981  MRN: [id number]  CC: Annual Physical Exam  Subjective: Rebecca Watson is a 42 y.o. female who presents for annual physical exam.   Her concerns today include:  Anxiety depression - Fluoxetine Ortho - back pain, Meloxicam   Tylenol Over the counter vitamins  Ortho Declined referral to therapist  Vitamin d and iron checked   Patient Active Problem List   Diagnosis Date Noted   Lumbar radiculopathy 11/14/2022   Vaginal discharge 10/12/2020   Anxiety and depression 10/12/2020   IUD (intrauterine device) in place 10/12/2020   Complex tear of lateral meniscus of right knee as current injury 08/04/2018   Unilateral primary osteoarthritis, right knee 08/04/2018   Chronic pain of right knee 08/04/2018   Endometriosis determined by laparoscopy 12/05/2017     Current Outpatient Medications on File Prior to Visit  Medication Sig Dispense Refill   acetaminophen (TYLENOL) 500 MG tablet Take 1,000 mg by mouth every 6 (six) hours as needed for moderate pain or headache.     FLUoxetine (PROZAC) 20 MG capsule Take 1 capsule (20 mg total) by mouth daily. 30 capsule 0   gabapentin (NEURONTIN) 300 MG capsule Take 1 capsule (300 mg total) by mouth 3 (three) times daily. 90 capsule 1   hydrOXYzine (VISTARIL) 25 MG capsule Take 1 capsule (25 mg total) by mouth every 8 (eight) hours as needed. 30 capsule 1   meloxicam (MOBIC) 7.5 MG tablet Take 1 tablet (7.5 mg total) by mouth daily. (Patient not taking: Reported on 03/18/2023) 30 tablet 1   No current facility-administered medications on file prior to visit.    Allergies  Allergen Reactions   Flagyl [Metronidazole] Nausea And Vomiting    Vomiting     Social History   Socioeconomic History   Marital status: Single    Spouse name: Not on file   Number of children: 2   Years of education: Not on file   Highest education level: Not on file  Occupational History   Not on file   Tobacco Use   Smoking status: Every Day    Packs/day: .5    Types: Cigarettes   Smokeless tobacco: Never  Vaping Use   Vaping Use: Some days   Start date: 10/29/2022  Substance and Sexual Activity   Alcohol use: Yes    Comment: ocassionally maybe 1   Drug use: Yes    Types: Marijuana    Comment: weaning off - smokes some not daily. Smoke marijuana this am. 12-14-20 uses THC gumies at night   Sexual activity: Yes    Birth control/protection: None    Comment: IUD taken on Nov 2023  Other Topics Concern   Not on file  Social History Narrative   Not on file   Social Determinants of Health   Financial Resource Strain: Not on file  Food Insecurity: No Food Insecurity (10/12/2020)   Hunger Vital Sign    Worried About Running Out of Food in the Last Year: Never true    Ran Out of Food in the Last Year: Never true  Transportation Needs: No Transportation Needs (10/12/2020)   PRAPARE - Administrator, Civil Service (Medical): No    Lack of Transportation (Non-Medical): No  Recent Concern: Transportation Needs - Unmet Transportation Needs (09/14/2020)   PRAPARE - Administrator, Civil Service (Medical): Yes    Lack of Transportation (Non-Medical): Yes  Physical Activity: Not on  file  Stress: Not on file  Social Connections: Not on file  Intimate Partner Violence: Not on file    Family History  Problem Relation Age of Onset   Bone cancer Mother        deceased age 72    Stroke Father    High blood pressure Father    COPD Father    Alcoholism Father    High Cholesterol Father    Colon cancer Neg Hx    Esophageal cancer Neg Hx    Colon polyps Neg Hx    Rectal cancer Neg Hx    Stomach cancer Neg Hx     Past Surgical History:  Procedure Laterality Date   CESAREAN SECTION     with twins  x 1    IUD REMOVAL     broken IUD removed    TUBAL LIGATION     x both     ROS: Review of Systems Negative except as stated above  PHYSICAL EXAM: BP  130/83 (BP Location: Right Arm, Patient Position: Sitting, Cuff Size: Large)   Pulse 96   Wt 218 lb 9.6 oz (99.2 kg)   SpO2 97%   BMI 35.28 kg/m   Physical Exam  {female adult master:310786} {female adult master:310785}     Latest Ref Rng & Units 03/08/2021   11:33 AM 03/17/2020   10:16 AM 10/15/2019    4:37 PM  CMP  Glucose 70 - 99 mg/dL 62  78  77   BUN 6 - 23 mg/dL 7  10  11    Creatinine 0.40 - 1.20 mg/dL 1.61  0.96  0.45   Sodium 135 - 145 mEq/L 139  138  138   Potassium 3.5 - 5.1 mEq/L 3.9  4.0  3.9   Chloride 96 - 112 mEq/L 105  105  105   CO2 19 - 32 mEq/L 25  25  25    Calcium 8.4 - 10.5 mg/dL 9.2  9.2  9.2   Total Protein 6.0 - 8.3 g/dL  6.9    Total Bilirubin 0.2 - 1.2 mg/dL  0.4    Alkaline Phos 39 - 117 U/L  67    AST 0 - 37 U/L  12    ALT 0 - 35 U/L  7     Lipid Panel     Component Value Date/Time   CHOL 193 10/15/2019 1637   TRIG 55.0 10/15/2019 1637   HDL 70.80 10/15/2019 1637   CHOLHDL 3 10/15/2019 1637   VLDL 11.0 10/15/2019 1637   LDLCALC 112 (H) 10/15/2019 1637    CBC    Component Value Date/Time   WBC 6.1 03/08/2021 1133   RBC 4.98 03/08/2021 1133   HGB 12.3 03/08/2021 1133   HCT 38.4 03/08/2021 1133   PLT 256.0 03/08/2021 1133   MCV 77.2 (L) 03/08/2021 1133   MCH 26.0 10/03/2016 2206   MCHC 32.1 03/08/2021 1133   RDW 18.8 (H) 03/08/2021 1133   LYMPHSABS 2.4 03/08/2021 1133   MONOABS 0.6 03/08/2021 1133   EOSABS 0.1 03/08/2021 1133   BASOSABS 0.1 03/08/2021 1133    ASSESSMENT AND PLAN:  There are no diagnoses linked to this encounter.   Patient was given the opportunity to ask questions.  Patient verbalized understanding of the plan and was able to repeat key elements of the plan. Patient was given clear instructions to go to Emergency Department or return to medical center if symptoms don't improve, worsen, or new problems develop.The patient verbalized understanding.  Orders Placed This Encounter  Procedures   MM Digital  Screening   CBC   Lipid panel   TSH   CMP14+EGFR   Hemoglobin A1c     Requested Prescriptions   Pending Prescriptions Disp Refills   FLUoxetine (PROZAC) 20 MG capsule 30 capsule 0    Sig: Take 1 capsule (20 mg total) by mouth daily.    Return in about 1 year (around 03/17/2024) for Physical per patient preference.  Rema Fendt, NP

## 2023-03-14 ENCOUNTER — Ambulatory Visit: Payer: Medicaid Other | Admitting: Family

## 2023-03-15 ENCOUNTER — Encounter: Payer: Self-pay | Admitting: Physician Assistant

## 2023-03-18 ENCOUNTER — Other Ambulatory Visit (HOSPITAL_COMMUNITY): Payer: Self-pay

## 2023-03-18 ENCOUNTER — Other Ambulatory Visit: Payer: Medicaid Other

## 2023-03-18 ENCOUNTER — Encounter: Payer: Self-pay | Admitting: Family

## 2023-03-18 ENCOUNTER — Ambulatory Visit: Payer: Medicaid Other | Admitting: Family

## 2023-03-18 VITALS — BP 130/83 | HR 96 | Wt 218.6 lb

## 2023-03-18 DIAGNOSIS — F32A Depression, unspecified: Secondary | ICD-10-CM | POA: Diagnosis not present

## 2023-03-18 DIAGNOSIS — Z13220 Encounter for screening for lipoid disorders: Secondary | ICD-10-CM

## 2023-03-18 DIAGNOSIS — F419 Anxiety disorder, unspecified: Secondary | ICD-10-CM | POA: Diagnosis not present

## 2023-03-18 DIAGNOSIS — M25561 Pain in right knee: Secondary | ICD-10-CM

## 2023-03-18 DIAGNOSIS — Z131 Encounter for screening for diabetes mellitus: Secondary | ICD-10-CM | POA: Diagnosis not present

## 2023-03-18 DIAGNOSIS — Z130 Encounter for screening for diseases of the blood and blood-forming organs and certain disorders involving the immune mechanism: Secondary | ICD-10-CM | POA: Diagnosis not present

## 2023-03-18 DIAGNOSIS — G8929 Other chronic pain: Secondary | ICD-10-CM

## 2023-03-18 DIAGNOSIS — Z1231 Encounter for screening mammogram for malignant neoplasm of breast: Secondary | ICD-10-CM

## 2023-03-18 DIAGNOSIS — E559 Vitamin D deficiency, unspecified: Secondary | ICD-10-CM | POA: Diagnosis not present

## 2023-03-18 DIAGNOSIS — Z0000 Encounter for general adult medical examination without abnormal findings: Secondary | ICD-10-CM

## 2023-03-18 DIAGNOSIS — F17210 Nicotine dependence, cigarettes, uncomplicated: Secondary | ICD-10-CM | POA: Diagnosis not present

## 2023-03-18 DIAGNOSIS — M545 Low back pain, unspecified: Secondary | ICD-10-CM

## 2023-03-18 DIAGNOSIS — Z13228 Encounter for screening for other metabolic disorders: Secondary | ICD-10-CM | POA: Diagnosis not present

## 2023-03-18 DIAGNOSIS — Z0001 Encounter for general adult medical examination with abnormal findings: Secondary | ICD-10-CM

## 2023-03-18 DIAGNOSIS — Z1329 Encounter for screening for other suspected endocrine disorder: Secondary | ICD-10-CM

## 2023-03-18 MED ORDER — GABAPENTIN 300 MG PO CAPS
300.0000 mg | ORAL_CAPSULE | Freq: Every day | ORAL | 1 refills | Status: DC
Start: 2023-03-18 — End: 2023-04-29
  Filled 2023-03-18: qty 90, 90d supply, fill #0

## 2023-03-18 MED ORDER — FLUOXETINE HCL 40 MG PO CAPS
40.0000 mg | ORAL_CAPSULE | Freq: Every day | ORAL | 1 refills | Status: DC
Start: 2023-03-18 — End: 2023-04-23
  Filled 2023-03-18: qty 30, 30d supply, fill #0
  Filled 2023-04-12: qty 30, 30d supply, fill #1

## 2023-03-18 NOTE — Patient Instructions (Signed)
Preventive Care 40-43 Years Old, Female Preventive care refers to lifestyle choices and visits with your health care provider that can promote health and wellness. Preventive care visits are also called wellness exams. What can I expect for my preventive care visit? Counseling Your health care provider may ask you questions about your: Medical history, including: Past medical problems. Family medical history. Pregnancy history. Current health, including: Menstrual cycle. Method of birth control. Emotional well-being. Home life and relationship well-being. Sexual activity and sexual health. Lifestyle, including: Alcohol, nicotine or tobacco, and drug use. Access to firearms. Diet, exercise, and sleep habits. Work and work environment. Sunscreen use. Safety issues such as seatbelt and bike helmet use. Physical exam Your health care provider will check your: Height and weight. These may be used to calculate your BMI (body mass index). BMI is a measurement that tells if you are at a healthy weight. Waist circumference. This measures the distance around your waistline. This measurement also tells if you are at a healthy weight and may help predict your risk of certain diseases, such as type 2 diabetes and high blood pressure. Heart rate and blood pressure. Body temperature. Skin for abnormal spots. What immunizations do I need?  Vaccines are usually given at various ages, according to a schedule. Your health care provider will recommend vaccines for you based on your age, medical history, and lifestyle or other factors, such as travel or where you work. What tests do I need? Screening Your health care provider may recommend screening tests for certain conditions. This may include: Lipid and cholesterol levels. Diabetes screening. This is done by checking your blood sugar (glucose) after you have not eaten for a while (fasting). Pelvic exam and Pap test. Hepatitis B test. Hepatitis C  test. HIV (human immunodeficiency virus) test. STI (sexually transmitted infection) testing, if you are at risk. Lung cancer screening. Colorectal cancer screening. Mammogram. Talk with your health care provider about when you should start having regular mammograms. This may depend on whether you have a family history of breast cancer. BRCA-related cancer screening. This may be done if you have a family history of breast, ovarian, tubal, or peritoneal cancers. Bone density scan. This is done to screen for osteoporosis. Talk with your health care provider about your test results, treatment options, and if necessary, the need for more tests. Follow these instructions at home: Eating and drinking  Eat a diet that includes fresh fruits and vegetables, whole grains, lean protein, and low-fat dairy products. Take vitamin and mineral supplements as recommended by your health care provider. Do not drink alcohol if: Your health care provider tells you not to drink. You are pregnant, may be pregnant, or are planning to become pregnant. If you drink alcohol: Limit how much you have to 0-1 drink a day. Know how much alcohol is in your drink. In the [place] one drink equals one 12 oz bottle of beer (355 mL), one 5 oz glass of wine (148 mL), or one 1 oz glass of hard liquor (44 mL). Lifestyle Brush your teeth every morning and night with fluoride toothpaste. Floss one time each day. Exercise for at least 30 minutes 5 or more days each week. Do not use any products that contain nicotine or tobacco. These products include cigarettes, chewing tobacco, and vaping devices, such as e-cigarettes. If you need help quitting, ask your health care provider. Do not use drugs. If you are sexually active, practice safe sex. Use a condom or other form of protection to   prevent STIs. If you do not wish to become pregnant, use a form of birth control. If you plan to become pregnant, see your health care provider for a  prepregnancy visit. Take aspirin only as told by your health care provider. Make sure that you understand how much to take and what form to take. Work with your health care provider to find out whether it is safe and beneficial for you to take aspirin daily. Find healthy ways to manage stress, such as: Meditation, yoga, or listening to music. Journaling. Talking to a trusted person. Spending time with friends and family. Minimize exposure to UV radiation to reduce your risk of skin cancer. Safety Always wear your seat belt while driving or riding in a vehicle. Do not drive: If you have been drinking alcohol. Do not ride with someone who has been drinking. When you are tired or distracted. While texting. If you have been using any mind-altering substances or drugs. Wear a helmet and other protective equipment during sports activities. If you have firearms in your house, make sure you follow all gun safety procedures. Seek help if you have been physically or sexually abused. What's next? Visit your health care provider once a year for an annual wellness visit. Ask your health care provider how often you should have your eyes and teeth checked. Stay up to date on all vaccines. This information is not intended to replace advice given to you by your health care provider. Make sure you discuss any questions you have with your health care provider. Document Revised: 04/12/2021 Document Reviewed: 04/12/2021 Elsevier Patient Education  2023 [employer]  

## 2023-03-18 NOTE — Progress Notes (Signed)
Patient her for annual physical and medication refill  and also wanting orders added for Vit D and iron

## 2023-03-22 LAB — VITAMIN D 25 HYDROXY (VIT D DEFICIENCY, FRACTURES): Vit D, 25-Hydroxy: 5 ng/mL — ABNORMAL LOW (ref 30.0–100.0)

## 2023-03-22 LAB — IRON,TIBC AND FERRITIN PANEL
Ferritin: 12 ng/mL — ABNORMAL LOW (ref 15–150)
Iron Saturation: 5 % — CL (ref 15–55)
Iron: 21 ug/dL — ABNORMAL LOW (ref 27–159)
Total Iron Binding Capacity: 462 ug/dL — ABNORMAL HIGH (ref 250–450)
UIBC: 441 ug/dL — ABNORMAL HIGH (ref 131–425)

## 2023-03-23 LAB — CMP14+EGFR
ALT: 5 IU/L (ref 0–32)
AST: 14 IU/L (ref 0–40)
Albumin/Globulin Ratio: 1.6 (ref 1.2–2.2)
Albumin: 4.5 g/dL (ref 3.9–4.9)
Alkaline Phosphatase: 83 IU/L (ref 44–121)
BUN/Creatinine Ratio: 15 (ref 9–23)
BUN: 11 mg/dL (ref 6–24)
Bilirubin Total: <0.2 mg/dL (ref 0.0–1.2)
CO2: 17 mmol/L — ABNORMAL LOW (ref 20–29)
Calcium: 9.1 mg/dL (ref 8.7–10.2)
Chloride: 102 mmol/L (ref 96–106)
Creatinine, Ser: 0.74 mg/dL (ref 0.57–1.00)
Globulin, Total: 2.8 g/dL (ref 1.5–4.5)
Glucose: 60 mg/dL — ABNORMAL LOW (ref 70–99)
Potassium: 3.9 mmol/L (ref 3.5–5.2)
Sodium: 135 mmol/L (ref 134–144)
Total Protein: 7.3 g/dL (ref 6.0–8.5)
eGFR: 104 mL/min/{1.73_m2} (ref >59)

## 2023-03-23 LAB — CBC
Hematocrit: 37.7 % (ref 34.0–46.6)
Hemoglobin: 10.8 g/dL — ABNORMAL LOW (ref 11.1–15.9)
MCH: 20.8 pg — ABNORMAL LOW (ref 26.6–33.0)
MCHC: 28.6 g/dL — ABNORMAL LOW (ref 31.5–35.7)
MCV: 73 fL — ABNORMAL LOW (ref 79–97)
Platelets: 360 10*3/uL (ref 150–450)
RBC: 5.2 x10E6/uL (ref 3.77–5.28)
RDW: 16.9 % — ABNORMAL HIGH (ref 11.7–15.4)
WBC: 7.2 10*3/uL (ref 3.4–10.8)

## 2023-03-23 LAB — LIPID PANEL
Chol/HDL Ratio: 2.7 ratio (ref 0.0–4.4)
Cholesterol, Total: 191 mg/dL (ref 100–199)
HDL: 70 mg/dL (ref >39)
LDL Chol Calc (NIH): 101 mg/dL — ABNORMAL HIGH (ref 0–99)
Triglycerides: 116 mg/dL (ref 0–149)
VLDL Cholesterol Cal: 20 mg/dL (ref 5–40)

## 2023-03-23 LAB — TSH: TSH: 1.15 u[IU]/mL (ref 0.450–4.500)

## 2023-03-23 LAB — HEMOGLOBIN A1C
Est. average glucose Bld gHb Est-mCnc: 103 mg/dL
Hgb A1c MFr Bld: 5.2 % (ref 4.8–5.6)

## 2023-03-23 LAB — SPECIMEN STATUS REPORT

## 2023-03-25 ENCOUNTER — Encounter: Payer: Self-pay | Admitting: Family

## 2023-03-26 ENCOUNTER — Other Ambulatory Visit: Payer: Self-pay | Admitting: Family

## 2023-03-26 ENCOUNTER — Other Ambulatory Visit (HOSPITAL_COMMUNITY): Payer: Self-pay

## 2023-03-26 DIAGNOSIS — E611 Iron deficiency: Secondary | ICD-10-CM

## 2023-03-26 DIAGNOSIS — D649 Anemia, unspecified: Secondary | ICD-10-CM

## 2023-03-26 DIAGNOSIS — E559 Vitamin D deficiency, unspecified: Secondary | ICD-10-CM

## 2023-03-26 MED ORDER — VITAMIN D (ERGOCALCIFEROL) 1.25 MG (50000 UNIT) PO CAPS
50000.0000 [IU] | ORAL_CAPSULE | ORAL | 0 refills | Status: AC
Start: 2023-03-26 — End: 2023-06-19
  Filled 2023-03-26: qty 12, 84d supply, fill #0

## 2023-03-26 NOTE — Telephone Encounter (Signed)
Please schedule appointment. During the interim report to the Emergency Department for immediate evaluation/management.

## 2023-03-29 ENCOUNTER — Telehealth: Payer: Self-pay

## 2023-03-29 NOTE — Telephone Encounter (Signed)
-----   Message from Rema Fendt, NP sent at 03/26/2023  4:27 PM EDT ----- - Kidney function normal. - Liver function normal. - Thyroid function normal.  - Cholesterol stable. - No diabetes.   The following abnormalities are noted:    All other values are normal, stable or within acceptable limits. - Vitamin D lower than normal. - Anemia/low iron.  Medication changes / Follow up labs / Other changes or recommendations:   - Vitamin D supplement prescribed. Please call our office to schedule an appointment to recheck Vitamin D levels in 12 weeks. - Referral to Hematology/Oncology for iron infusion. Expect call within 2 weeks with appointment details.  - During the interim for immediate medical evaluation report to the Emergency Department and follow-up with primary provider at discharge.

## 2023-03-29 NOTE — Telephone Encounter (Signed)
Pt was called and vm was left, Information has been sent to nurse pool.   

## 2023-04-13 ENCOUNTER — Other Ambulatory Visit: Payer: Medicaid Other

## 2023-04-20 ENCOUNTER — Encounter (HOSPITAL_COMMUNITY): Payer: Self-pay

## 2023-04-20 ENCOUNTER — Ambulatory Visit (INDEPENDENT_AMBULATORY_CARE_PROVIDER_SITE_OTHER): Payer: Medicaid Other

## 2023-04-20 ENCOUNTER — Ambulatory Visit (HOSPITAL_COMMUNITY)
Admission: EM | Admit: 2023-04-20 | Discharge: 2023-04-20 | Disposition: A | Discharge status: Home / Self Care | Payer: Medicaid Other

## 2023-04-20 DIAGNOSIS — R0789 Other chest pain: Secondary | ICD-10-CM

## 2023-04-20 DIAGNOSIS — R051 Acute cough: Secondary | ICD-10-CM

## 2023-04-20 NOTE — Discharge Instructions (Addendum)
We obtained a chest x-ray today today in clinic and we will contact you if anything results is abnormal.  Your physical exam is reassuring.  I suggest cutting down or quitting smoking.  This also includes vaping.  Please return to clinic or follow-up with your primary care provider for any new or concerning symptoms.

## 2023-04-20 NOTE — ED Provider Notes (Signed)
[facility]    CSN: [id number] Arrival date & time: 04/20/23  1740      History   Chief Complaint Chief Complaint  Patient presents with   Wheezing    HPI Rebecca Watson is a 42 y.o. female.   Patient presents to clinic for intermittent cough and shortness of breath with strenuous exercise.  She has also been sweating more for the past few weeks.  She denies any fevers or recent sick contacts, however, she does work in the hospital.  When exercising her abdominals with side-to-side movement, she would develop some left-sided chest pain, shortness of breath, and will have a dry cough.  Cough is only present with strenuous movement.  She does smoke half a pack of cigarettes daily, has been vaping to try and wean herself from the cigarettes.    The history is provided by the patient and medical records.  Wheezing Associated symptoms: cough and shortness of breath   Associated symptoms: no fever and no sore throat     Past Medical History:  Diagnosis Date   Allergy    Anemia    low FE per pt    Constipation    Diverticulitis    Diverticulosis    Dry eyes    GERD (gastroesophageal reflux disease)    Obesity    Thyroid disease    Trouble in sleeping     Patient Active Problem List   Diagnosis Date Noted   Vitamin D deficiency 03/26/2023   Lumbar radiculopathy 11/14/2022   Vaginal discharge 10/12/2020   Anxiety and depression 10/12/2020   IUD (intrauterine device) in place 10/12/2020   Complex tear of lateral meniscus of right knee as current injury 08/04/2018   Unilateral primary osteoarthritis, right knee 08/04/2018   Chronic pain of right knee 08/04/2018   Endometriosis determined by laparoscopy 12/05/2017    Past Surgical History:  Procedure Laterality Date   CESAREAN SECTION     with twins  x 1    IUD REMOVAL     broken IUD removed    TUBAL LIGATION     x both     OB History     Gravida  2   Para  1   Term      Preterm  1    AB      Living  2      SAB      IAB      Ectopic      Multiple  1   Live Births  2            Home Medications    Prior to Admission medications   Medication Sig Start Date End Date Taking? Authorizing Provider  acetaminophen (TYLENOL) 500 MG tablet Take 1,000 mg by mouth every 6 (six) hours as needed for moderate pain or headache.   Yes [provider]  cetirizine (ZYRTEC) 10 MG tablet Take 10 mg by mouth daily.   Yes [provider]  FLUoxetine (PROZAC) 40 MG capsule Take 1 capsule (40 mg total) by mouth daily. 03/18/23  Yes Zonia Kief, Amy J, NP  gabapentin (NEURONTIN) 300 MG capsule Take 1 capsule (300 mg total) by mouth at bedtime. 03/18/23  Yes Zonia Kief, Amy J, NP  hydrOXYzine (VISTARIL) 25 MG capsule Take 1 capsule (25 mg total) by mouth every 8 (eight) hours as needed. 02/12/23  Yes Zonia Kief, Amy J, NP  Vitamin D, Ergocalciferol, (DRISDOL) 1.25 MG (50000 UNIT) CAPS capsule Take 1 capsule (  50,000 Units total) by mouth every 7 (seven) days for 12 doses. 03/26/23 06/19/23 Yes Zonia Kief, Amy J, NP  meloxicam (MOBIC) 7.5 MG tablet Take 1 tablet (7.5 mg total) by mouth daily. Patient not taking: Reported on 03/18/2023 02/12/23   Rema Fendt, NP    Family History Family History  Problem Relation Age of Onset   Bone cancer Mother        deceased age 48    Stroke Father    High blood pressure Father    COPD Father    Alcoholism Father    High Cholesterol Father    Colon cancer Neg Hx    Esophageal cancer Neg Hx    Colon polyps Neg Hx    Rectal cancer Neg Hx    Stomach cancer Neg Hx     Social History Social History   Tobacco Use   Smoking status: Every Day    Packs/day: .5    Types: Cigarettes   Smokeless tobacco: Never  Vaping Use   Vaping Use: Some days   Start date: 10/29/2022  Substance Use Topics   Alcohol use: Yes    Comment: ocassionally maybe 1   Drug use: Yes    Types: Marijuana    Comment: weaning off - smokes some not daily.  Smoke marijuana this am. 12-14-20 uses THC gumies at night     Allergies   Flagyl [metronidazole]   Review of Systems Review of Systems  Constitutional:  Negative for fever.  HENT:  Negative for congestion and sore throat.   Respiratory:  Positive for cough and shortness of breath.      Physical Exam Triage Vital Signs ED Triage Vitals  Enc Vitals Group     BP 04/20/23 1800 129/87     Pulse Rate 04/20/23 1800 84     Resp 04/20/23 1800 18     Temp 04/20/23 1800 98.4 F (36.9 C)     Temp Source 04/20/23 1800 Oral     SpO2 04/20/23 1800 97 %     Weight 04/20/23 1759 222 lb (100.7 kg)     Height 04/20/23 1759 5\' 6"  (1.676 m)     Head Circumference --      Peak Flow --      Pain Score 04/20/23 1758 0     Pain Loc --      Pain Edu? --      Excl. in GC? --    No data found.  Updated Vital Signs BP 129/87 (BP Location: Left Arm)   Pulse 84   Temp 98.4 F (36.9 C) (Oral)   Resp 18   Ht 5\' 6"  (1.676 m)   Wt 222 lb (100.7 kg)   LMP 03/20/2023 (Approximate)   SpO2 97%   BMI 35.83 kg/m   Visual Acuity Right Eye Distance:   Left Eye Distance:   Bilateral Distance:    Right Eye Near:   Left Eye Near:    Bilateral Near:     Physical Exam Vitals and nursing note reviewed.  Constitutional:      Appearance: Normal appearance.  HENT:     Head: Normocephalic and atraumatic.     Right Ear: External ear normal.     Left Ear: External ear normal.     Nose: Nose normal.     Mouth/Throat:     Mouth: Mucous membranes are moist.  Eyes:     Conjunctiva/sclera: Conjunctivae normal.  Cardiovascular:     Rate and Rhythm: Normal rate  and regular rhythm.     Heart sounds: Normal heart sounds. No murmur heard. Pulmonary:     Effort: Pulmonary effort is normal. No respiratory distress.     Breath sounds: Normal breath sounds.  Skin:    General: Skin is warm and dry.  Neurological:     General: No focal deficit present.     Mental Status: She is alert.  Psychiatric:         Mood and Affect: Mood normal.        Behavior: Behavior is cooperative.      UC Treatments / Results  Labs (all labs ordered are listed, but only abnormal results are displayed) Labs Reviewed - No data to display  EKG   Radiology No results found.  Procedures Procedures (including critical care time)  Medications Ordered in UC Medications - No data to display  Initial Impression / Assessment and Plan / UC Course  I have reviewed the triage vital signs and the nursing notes.  Pertinent labs & imaging results that were available during my care of the patient were reviewed by me and considered in my medical decision making (see chart for details).  Vitals and triage reviewed, patient is hemodynamically stable.  Left-sided chest wall discomfort, nonproductive cough and shortness of breath with strenuous movement.  Area is nontender to palpation. Lungs are vesicular posteriorly.  No active chest pain.  Chest x-ray obtained today in clinic to rule out infiltrate, or acute abnormality.  Will contact patient if this results is abnormal.  Encouraged patient to cut down or quit smoking, suspect this is contributing to her symptoms.  Plan of care, follow-up care and return precautions given, no questions at this time.    Final Clinical Impressions(s) / UC Diagnoses   Final diagnoses:  Acute cough  Chest wall pain     Discharge Instructions      We obtained a chest x-ray today today in clinic and we will contact you if anything results is abnormal.  Your physical exam is reassuring.  I suggest cutting down or quitting smoking.  This also includes vaping.  Please return to clinic or follow-up with your primary care provider for any new or concerning symptoms.      ED Prescriptions   None    PDMP not reviewed this encounter.   Temitope Griffing, [place], [place] 04/20/23 [phone]

## 2023-04-20 NOTE — ED Triage Notes (Signed)
When moving in certain ways having some right side chest discomfort that leads to SOB. States there is a rubbing sensation that makes her cough.   Onset 2 weeks ago. No respiratory history. No recent sickness. Patient has seasonal allergies.

## 2023-04-23 ENCOUNTER — Encounter: Payer: Self-pay | Admitting: Family

## 2023-04-23 ENCOUNTER — Other Ambulatory Visit (HOSPITAL_COMMUNITY): Payer: Self-pay

## 2023-04-23 ENCOUNTER — Ambulatory Visit (INDEPENDENT_AMBULATORY_CARE_PROVIDER_SITE_OTHER): Payer: Medicaid Other | Admitting: Family

## 2023-04-23 VITALS — BP 129/85 | HR 77 | Temp 98.5°F | Ht 67.0 in | Wt 211.8 lb

## 2023-04-23 DIAGNOSIS — F32A Depression, unspecified: Secondary | ICD-10-CM

## 2023-04-23 DIAGNOSIS — F419 Anxiety disorder, unspecified: Secondary | ICD-10-CM | POA: Diagnosis not present

## 2023-04-23 DIAGNOSIS — J3089 Other allergic rhinitis: Secondary | ICD-10-CM | POA: Diagnosis not present

## 2023-04-23 MED ORDER — HYDROXYZINE PAMOATE 25 MG PO CAPS
25.0000 mg | ORAL_CAPSULE | Freq: Three times a day (TID) | ORAL | 2 refills | Status: AC | PRN
Start: 2023-04-23 — End: ?
  Filled 2023-04-23 – 2023-05-07 (×2): qty 30, 10d supply, fill #0

## 2023-04-23 MED ORDER — LEVOCETIRIZINE DIHYDROCHLORIDE 5 MG PO TABS
5.0000 mg | ORAL_TABLET | Freq: Every evening | ORAL | 2 refills | Status: AC
Start: 2023-04-23 — End: ?
  Filled 2023-04-23 – 2023-09-01 (×4): qty 30, 30d supply, fill #0

## 2023-04-23 MED ORDER — FLUOXETINE HCL 40 MG PO CAPS
40.0000 mg | ORAL_CAPSULE | Freq: Every day | ORAL | 2 refills | Status: AC
Start: 2023-04-23 — End: ?
  Filled 2023-04-23 – 2023-05-19 (×2): qty 30, 30d supply, fill #0
  Filled 2023-06-21 – 2023-09-01 (×2): qty 30, 30d supply, fill #1

## 2023-04-23 NOTE — Progress Notes (Unsigned)
Patient ID: Rebecca Watson, female    DOB: 09/28/1981  MRN: [id number]  CC: Follow-up and Labs Only   Subjective: Rebecca Watson is a 42 y.o. female who presents for Her concerns today include: ***  Patient Active Problem List   Diagnosis Date Noted   Vitamin D deficiency 03/26/2023   Lumbar radiculopathy 11/14/2022   Vaginal discharge 10/12/2020   Anxiety and depression 10/12/2020   IUD (intrauterine device) in place 10/12/2020   Complex tear of lateral meniscus of right knee as current injury 08/04/2018   Unilateral primary osteoarthritis, right knee 08/04/2018   Chronic pain of right knee 08/04/2018   Endometriosis determined by laparoscopy 12/05/2017     Current Outpatient Medications on File Prior to Visit  Medication Sig Dispense Refill   acetaminophen (TYLENOL) 500 MG tablet Take 1,000 mg by mouth every 6 (six) hours as needed for moderate pain or headache.     cetirizine (ZYRTEC) 10 MG tablet Take 10 mg by mouth daily.     FLUoxetine (PROZAC) 40 MG capsule Take 1 capsule (40 mg total) by mouth daily. 30 capsule 1   hydrOXYzine (VISTARIL) 25 MG capsule Take 1 capsule (25 mg total) by mouth every 8 (eight) hours as needed. 30 capsule 1   Vitamin D, Ergocalciferol, (DRISDOL) 1.25 MG (50000 UNIT) CAPS capsule Take 1 capsule (50,000 Units total) by mouth every 7 (seven) days for 12 doses. 12 capsule 0   gabapentin (NEURONTIN) 300 MG capsule Take 1 capsule (300 mg total) by mouth at bedtime. (Patient not taking: Reported on 04/23/2023) 90 capsule 1   meloxicam (MOBIC) 7.5 MG tablet Take 1 tablet (7.5 mg total) by mouth daily. (Patient not taking: Reported on 03/18/2023) 30 tablet 1   No current facility-administered medications on file prior to visit.    Allergies  Allergen Reactions   Flagyl [Metronidazole] Nausea And Vomiting    Vomiting     Social History   Socioeconomic History   Marital status: Single    Spouse name: Not on file   Number of children: 2    Years of education: Not on file   Highest education level: Not on file  Occupational History   Not on file  Tobacco Use   Smoking status: Every Day    Packs/day: .5    Types: Cigarettes   Smokeless tobacco: Never  Vaping Use   Vaping Use: Some days   Start date: 10/29/2022  Substance and Sexual Activity   Alcohol use: Yes    Comment: ocassionally maybe 1   Drug use: Yes    Types: Marijuana    Comment: weaning off - smokes some not daily. Smoke marijuana this am. 12-14-20 uses THC gumies at night   Sexual activity: Yes    Birth control/protection: None    Comment: IUD taken on Nov 2023  Other Topics Concern   Not on file  Social History Narrative   Not on file   Social Determinants of Health   Financial Resource Strain: Not on file  Food Insecurity: No Food Insecurity (10/12/2020)   Hunger Vital Sign    Worried About Running Out of Food in the Last Year: Never true    Ran Out of Food in the Last Year: Never true  Transportation Needs: No Transportation Needs (10/12/2020)   PRAPARE - Administrator, Civil Service (Medical): No    Lack of Transportation (Non-Medical): No  Recent Concern: Transportation Needs - Unmet Transportation Needs (09/14/2020)   PRAPARE -  Administrator, Civil Service (Medical): Yes    Lack of Transportation (Non-Medical): Yes  Physical Activity: Not on file  Stress: Not on file  Social Connections: Not on file  Intimate Partner Violence: Not on file    Family History  Problem Relation Age of Onset   Bone cancer Mother        deceased age 48    Stroke Father    High blood pressure Father    COPD Father    Alcoholism Father    High Cholesterol Father    Colon cancer Neg Hx    Esophageal cancer Neg Hx    Colon polyps Neg Hx    Rectal cancer Neg Hx    Stomach cancer Neg Hx     Past Surgical History:  Procedure Laterality Date   CESAREAN SECTION     with twins  x 1    IUD REMOVAL     broken IUD removed    TUBAL  LIGATION     x both     ROS: Review of Systems Negative except as stated above  PHYSICAL EXAM: BP 129/85   Pulse 77   Temp 98.5 F (36.9 C) (Oral)   Ht 5\' 7"  (1.702 m)   Wt 211 lb 12.8 oz (96.1 kg)   LMP 03/20/2023 (Approximate)   SpO2 96%   BMI 33.17 kg/m   Physical Exam  {female adult master:310786} {female adult master:310785}     Latest Ref Rng & Units 03/18/2023    4:34 PM 03/08/2021   11:33 AM 03/17/2020   10:16 AM  CMP  Glucose 70 - 99 mg/dL 60  62  78   BUN 6 - 24 mg/dL 11  7  10    Creatinine 0.57 - 1.00 mg/dL 2.95  2.84  1.32   Sodium 134 - 144 mmol/L 135  139  138   Potassium 3.5 - 5.2 mmol/L 3.9  3.9  4.0   Chloride 96 - 106 mmol/L 102  105  105   CO2 20 - 29 mmol/L 17  25  25    Calcium 8.7 - 10.2 mg/dL 9.1  9.2  9.2   Total Protein 6.0 - 8.5 g/dL 7.3   6.9   Total Bilirubin 0.0 - 1.2 mg/dL <4.4   0.4   Alkaline Phos 44 - 121 IU/L 83   67   AST 0 - 40 IU/L 14   12   ALT 0 - 32 IU/L 5   7    Lipid Panel     Component Value Date/Time   CHOL 191 03/18/2023 1634   TRIG 116 03/18/2023 1634   HDL 70 03/18/2023 1634   CHOLHDL 2.7 03/18/2023 1634   CHOLHDL 3 10/15/2019 1637   VLDL 11.0 10/15/2019 1637   LDLCALC 101 (H) 03/18/2023 1634    CBC    Component Value Date/Time   WBC 7.2 03/18/2023 1634   WBC 6.1 03/08/2021 1133   RBC 5.20 03/18/2023 1634   RBC 4.98 03/08/2021 1133   HGB 10.8 (L) 03/18/2023 1634   HCT 37.7 03/18/2023 1634   PLT 360 03/18/2023 1634   MCV 73 (L) 03/18/2023 1634   MCH 20.8 (L) 03/18/2023 1634   MCH 26.0 10/03/2016 2206   MCHC 28.6 (L) 03/18/2023 1634   MCHC 32.1 03/08/2021 1133   RDW 16.9 (H) 03/18/2023 1634   LYMPHSABS 2.4 03/08/2021 1133   MONOABS 0.6 03/08/2021 1133   EOSABS 0.1 03/08/2021 1133   BASOSABS 0.1 03/08/2021  1133    ASSESSMENT AND PLAN:  There are no diagnoses linked to this encounter.   Patient was given the opportunity to ask questions.  Patient verbalized understanding of the plan and was able  to repeat key elements of the plan. Patient was given clear instructions to go to Emergency Department or return to medical center if symptoms don't improve, worsen, or new problems develop.The patient verbalized understanding.   No orders of the defined types were placed in this encounter.    Requested Prescriptions    No prescriptions requested or ordered in this encounter    No follow-ups on file.  Rema Fendt, NP

## 2023-04-23 NOTE — Progress Notes (Unsigned)
Pt wants to check iron levels

## 2023-04-24 ENCOUNTER — Other Ambulatory Visit (HOSPITAL_COMMUNITY): Payer: Self-pay

## 2023-04-29 ENCOUNTER — Other Ambulatory Visit: Payer: Self-pay

## 2023-04-29 ENCOUNTER — Encounter: Payer: Self-pay | Admitting: Hematology and Oncology

## 2023-04-29 ENCOUNTER — Inpatient Hospital Stay: Payer: Medicaid Other | Attending: Hematology and Oncology | Admitting: Hematology and Oncology

## 2023-04-29 ENCOUNTER — Inpatient Hospital Stay: Payer: Medicaid Other

## 2023-04-29 VITALS — BP 132/85 | HR 83 | Temp 98.4°F | Resp 18 | Ht 67.0 in | Wt 215.2 lb

## 2023-04-29 DIAGNOSIS — D219 Benign neoplasm of connective and other soft tissue, unspecified: Secondary | ICD-10-CM | POA: Insufficient documentation

## 2023-04-29 DIAGNOSIS — Z720 Tobacco use: Secondary | ICD-10-CM | POA: Insufficient documentation

## 2023-04-29 DIAGNOSIS — D259 Leiomyoma of uterus, unspecified: Secondary | ICD-10-CM | POA: Diagnosis not present

## 2023-04-29 DIAGNOSIS — D500 Iron deficiency anemia secondary to blood loss (chronic): Secondary | ICD-10-CM | POA: Insufficient documentation

## 2023-04-29 DIAGNOSIS — N920 Excessive and frequent menstruation with regular cycle: Secondary | ICD-10-CM | POA: Insufficient documentation

## 2023-04-29 DIAGNOSIS — F17210 Nicotine dependence, cigarettes, uncomplicated: Secondary | ICD-10-CM | POA: Insufficient documentation

## 2023-04-29 NOTE — Assessment & Plan Note (Signed)
We discussed the risk, benefits, side effects of IV iron that the patient declined I recommend she start taking oral iron supplement daily half an hour before dinner I plan to recheck in 3 months We discussed importance of management of menorrhagia

## 2023-04-29 NOTE — Assessment & Plan Note (Signed)
We discussed importance of nicotine cessation The patient is motivated to quit

## 2023-04-29 NOTE — Assessment & Plan Note (Signed)
The cause of her severe iron deficiency is due to menorrhagia We discussed importance of follow-up with gynecologist for discussion about treatment for uterine fibroids I can potentially prescribe Lupron for her in the future but I would not want to prescribe that when she is still smoking

## 2023-04-29 NOTE — Progress Notes (Signed)
[facility] CONSULT NOTE  Patient Care Team: Rema Fendt, NP as PCP - General (Nurse Practitioner)  ASSESSMENT & PLAN:  Iron deficiency anemia due to chronic blood loss We discussed the risk, benefits, side effects of IV iron that the patient declined I recommend she start taking oral iron supplement daily half an hour before dinner I plan to recheck in 3 months We discussed importance of management of menorrhagia  Fibroids The cause of her severe iron deficiency is due to menorrhagia We discussed importance of follow-up with gynecologist for discussion about treatment for uterine fibroids I can potentially prescribe Lupron for her in the future but I would not want to prescribe that when she is still smoking  Tobacco abuse We discussed importance of nicotine cessation The patient is motivated to quit Orders Placed This Encounter  Procedures   Iron and Iron Binding Capacity ([facility] only)    Standing Status:   Future    Standing Expiration Date:   04/28/2024   Ferritin    Standing Status:   Future    Standing Expiration Date:   04/28/2024   CBC with Differential (Cancer Center Only)    Standing Status:   Future    Standing Expiration Date:   04/28/2024    All questions were answered. The patient knows to call the clinic with any problems, questions or concerns.  The total time spent in the appointment was 55 minutes encounter with patients including review of chart and various tests results, discussions about plan of care and coordination of care plan  Artis Delay, MD 7/1/202412:48 PM   CHIEF COMPLAINTS/PURPOSE OF CONSULTATION:  Anemia  HISTORY OF PRESENTING ILLNESS:  Rebecca Watson 42 y.o. female is here because of anemia  She was found to have abnormal CBC from routine blood work I have the opportunity to review her blood count dated back to 2009 Her hemoglobin fluctuated between 10.8-13.4 Most recently, her hemoglobin was 10.8, MCV of 73 with a  ferritin of 12 She denies recent chest pain on exertion, shortness of breath on minimal exertion, pre-syncopal episodes, or palpitations. She complains of fatigue.  She had pica with ice craving She has occasional hemorrhoidal bleeding once a month She had not noticed any recent bleeding such as epistaxis, hematuria or hemoptysis She complained of heavy menstruation.  She had several IUD placed and removed in the past.  The last IUD was removed around 2022 because it was not helpful to control menorrhagia Her menstrual cycle has been irregular recently. She is known to have uterine fibroids but never had abnormal Pap smear She did not desire to have any more children  The patient denies over the counter NSAID ingestion. She is not on antiplatelets agents. Her last colonoscopy was performed on December 14, 2020 It showed diffuse diverticular changes with signs of colitis.  EGD showed grade a esophagitis.  Biopsy confirmed gastritis, duodenitis and focal colitis She had no prior history or diagnosis of cancer. Her age appropriate screening programs are up-to-date. She eats a variety of diet. She had donated blood before but has never received blood transfusion She has not taken any iron supplements.  MEDICAL HISTORY:  Past Medical History:  Diagnosis Date   Allergy    Anemia    low FE per pt    Constipation    Diverticulitis    Diverticulosis    Dry eyes    GERD (gastroesophageal reflux disease)    Obesity    Thyroid disease  Trouble in sleeping     SURGICAL HISTORY: Past Surgical History:  Procedure Laterality Date   CESAREAN SECTION     with twins  x 1    IUD REMOVAL     broken IUD removed    TUBAL LIGATION     x both     SOCIAL HISTORY: Social History   Socioeconomic History   Marital status: Single    Spouse name: Not on file   Number of children: 2   Years of education: Not on file   Highest education level: Not on file  Occupational History   Not on file   Tobacco Use   Smoking status: Every Day    Packs/day: .5    Types: Cigarettes   Smokeless tobacco: Never  Vaping Use   Vaping Use: Some days   Start date: 10/29/2022  Substance and Sexual Activity   Alcohol use: Yes    Comment: ocassionally maybe 1   Drug use: Yes    Types: Marijuana    Comment: weaning off - smokes some not daily. Smoke marijuana this am. 12-14-20 uses THC gumies at night   Sexual activity: Yes    Birth control/protection: None    Comment: IUD taken on Nov 2023  Other Topics Concern   Not on file  Social History Narrative   Not on file   Social Determinants of Health   Financial Resource Strain: Not on file  Food Insecurity: No Food Insecurity (10/12/2020)   Hunger Vital Sign    Worried About Running Out of Food in the Last Year: Never true    Ran Out of Food in the Last Year: Never true  Transportation Needs: No Transportation Needs (10/12/2020)   PRAPARE - Administrator, Civil Service (Medical): No    Lack of Transportation (Non-Medical): No  Recent Concern: Transportation Needs - Unmet Transportation Needs (09/14/2020)   PRAPARE - Administrator, Civil Service (Medical): Yes    Lack of Transportation (Non-Medical): Yes  Physical Activity: Not on file  Stress: Not on file  Social Connections: Not on file  Intimate Partner Violence: Not on file    FAMILY HISTORY: Family History  Problem Relation Age of Onset   Bone cancer Mother        deceased age 12    Stroke Father    High blood pressure Father    COPD Father    Alcoholism Father    High Cholesterol Father    Colon cancer Neg Hx    Esophageal cancer Neg Hx    Colon polyps Neg Hx    Rectal cancer Neg Hx    Stomach cancer Neg Hx     ALLERGIES:  is allergic to flagyl [metronidazole].  MEDICATIONS:  Current Outpatient Medications  Medication Sig Dispense Refill   ferrous sulfate 325 (65 FE) MG EC tablet Take 325 mg by mouth daily.     acetaminophen (TYLENOL) 500  MG tablet Take 1,000 mg by mouth every 6 (six) hours as needed for moderate pain or headache.     FLUoxetine (PROZAC) 40 MG capsule Take 1 capsule (40 mg total) by mouth daily. 30 capsule 2   hydrOXYzine (VISTARIL) 25 MG capsule Take 1 capsule (25 mg total) by mouth every 8 (eight) hours as needed. 30 capsule 2   levocetirizine (XYZAL) 5 MG tablet Take 1 tablet (5 mg total) by mouth every evening. 30 tablet 2   Vitamin D, Ergocalciferol, (DRISDOL) 1.25 MG (50000 UNIT) CAPS  capsule Take 1 capsule (50,000 Units total) by mouth every 7 (seven) days for 12 doses. 12 capsule 0   No current facility-administered medications for this visit.    REVIEW OF SYSTEMS:   Constitutional: Denies fevers, chills or abnormal night sweats Eyes: Denies blurriness of vision, double vision or watery eyes Ears, nose, mouth, throat, and face: Denies mucositis or sore throat Respiratory: Denies cough, dyspnea or wheezes Cardiovascular: Denies palpitation, chest discomfort or lower extremity swelling Gastrointestinal:  Denies nausea, heartburn or change in bowel habits Skin: Denies abnormal skin rashes Lymphatics: Denies new lymphadenopathy or easy bruising Neurological:Denies numbness, tingling or new weaknesses Behavioral/Psych: Mood is stable, no new changes  All other systems were reviewed with the patient and are negative.  PHYSICAL EXAMINATION: ECOG PERFORMANCE STATUS: 1 - Symptomatic but completely ambulatory  Vitals:   04/29/23 1156  BP: 132/85  Pulse: 83  Resp: 18  Temp: 98.4 F (36.9 C)  SpO2: 100%   Filed Weights   04/29/23 1156  Weight: 215 lb 3.2 oz (97.6 kg)    GENERAL:alert, no distress and comfortable SKIN: skin color, texture, turgor are normal, no rashes or significant lesions EYES: normal, conjunctiva are pink and non-injected, sclera clear OROPHARYNX:no exudate, no erythema and lips, buccal mucosa, and tongue normal  NECK: supple, thyroid normal size, non-tender, without  nodularity LYMPH:  no palpable lymphadenopathy in the cervical, axillary or inguinal LUNGS: clear to auscultation and percussion with normal breathing effort HEART: regular rate & rhythm and no murmurs and no lower extremity edema ABDOMEN:abdomen soft, non-tender and normal bowel sounds Musculoskeletal:no cyanosis of digits and no clubbing  PSYCH: alert & oriented x 3 with fluent speech NEURO: no focal motor/sensory deficits  RADIOGRAPHIC STUDIES: I have reviewed prior CT imaging which show uterine fibroids I have personally reviewed the radiological images as listed and agreed with the findings in the report. DG Chest 2 View  Result Date: 04/20/2023 CLINICAL DATA:  SOB w/ movement EXAM: CHEST - 2 VIEW COMPARISON:  Chest x-ray 11/25/2009 FINDINGS: The heart and mediastinal contours are within normal limits. No focal consolidation. No pulmonary edema. No pleural effusion. No pneumothorax. No acute osseous abnormality. IMPRESSION: No active cardiopulmonary disease. Electronically Signed   By: Tish Frederickson M.D.   On: 04/20/2023 18:28

## 2023-05-06 ENCOUNTER — Other Ambulatory Visit (HOSPITAL_COMMUNITY): Payer: Self-pay

## 2023-05-07 ENCOUNTER — Other Ambulatory Visit (HOSPITAL_COMMUNITY): Payer: Self-pay

## 2023-05-08 ENCOUNTER — Other Ambulatory Visit (HOSPITAL_COMMUNITY): Payer: Self-pay

## 2023-05-16 ENCOUNTER — Other Ambulatory Visit (HOSPITAL_COMMUNITY): Payer: Self-pay

## 2023-05-20 ENCOUNTER — Other Ambulatory Visit (HOSPITAL_COMMUNITY): Payer: Self-pay

## 2023-06-21 ENCOUNTER — Other Ambulatory Visit: Payer: Self-pay

## 2023-06-21 ENCOUNTER — Encounter: Payer: Self-pay | Admitting: Pharmacist

## 2023-06-21 ENCOUNTER — Other Ambulatory Visit (HOSPITAL_COMMUNITY): Payer: Self-pay

## 2023-06-26 ENCOUNTER — Other Ambulatory Visit: Payer: Self-pay

## 2023-07-25 ENCOUNTER — Telehealth: Payer: Self-pay

## 2023-07-25 NOTE — Telephone Encounter (Signed)
Medicaid Managed Care   Unsuccessful Outreach Note  07/25/2023 Name: Rebecca Watson MRN: [id number] DOB: [birth year]  Referred by: Rema Fendt, NP Reason for referral : No chief complaint on file.   An unsuccessful telephone outreach was attempted today. The patient was referred to the case management team for assistance with care management and care coordination.   Follow Up Plan: If patient returns call to provider office, please advise to call Embedded Care Management Care Guide Nicholes Rough* at [phone]*  Nicholes Rough, CMA Care Guide [employer]

## 2023-07-29 ENCOUNTER — Inpatient Hospital Stay: Payer: Medicaid Other | Attending: Hematology and Oncology

## 2023-07-29 DIAGNOSIS — D219 Benign neoplasm of connective and other soft tissue, unspecified: Secondary | ICD-10-CM

## 2023-07-29 DIAGNOSIS — N920 Excessive and frequent menstruation with regular cycle: Secondary | ICD-10-CM | POA: Insufficient documentation

## 2023-07-29 DIAGNOSIS — D500 Iron deficiency anemia secondary to blood loss (chronic): Secondary | ICD-10-CM | POA: Diagnosis present

## 2023-07-29 LAB — CBC WITH DIFFERENTIAL (CANCER CENTER ONLY)
Abs Immature Granulocytes: 0.01 10*3/uL (ref 0.00–0.07)
Basophils Absolute: 0 10*3/uL (ref 0.0–0.1)
Basophils Relative: 1 %
Eosinophils Absolute: 0.1 10*3/uL (ref 0.0–0.5)
Eosinophils Relative: 2 %
HCT: 37.8 % (ref 36.0–46.0)
Hemoglobin: 11.3 g/dL — ABNORMAL LOW (ref 12.0–15.0)
Immature Granulocytes: 0 %
Lymphocytes Relative: 29 %
Lymphs Abs: 1.1 10*3/uL (ref 0.7–4.0)
MCH: 23 pg — ABNORMAL LOW (ref 26.0–34.0)
MCHC: 29.9 g/dL — ABNORMAL LOW (ref 30.0–36.0)
MCV: 77 fL — ABNORMAL LOW (ref 80.0–100.0)
Monocytes Absolute: 0.3 10*3/uL (ref 0.1–1.0)
Monocytes Relative: 8 %
Neutro Abs: 2.4 10*3/uL (ref 1.7–7.7)
Neutrophils Relative %: 60 %
Platelet Count: 303 10*3/uL (ref 150–400)
RBC: 4.91 MIL/uL (ref 3.87–5.11)
RDW: 18.7 % — ABNORMAL HIGH (ref 11.5–15.5)
WBC Count: 4 10*3/uL (ref 4.0–10.5)
nRBC: 0 % (ref 0.0–0.2)

## 2023-07-29 LAB — IRON AND IRON BINDING CAPACITY (CC-WL,HP ONLY)
Iron: 23 ug/dL — ABNORMAL LOW (ref 28–170)
Saturation Ratios: 4 % — ABNORMAL LOW (ref 10.4–31.8)
TIBC: 528 ug/dL — ABNORMAL HIGH (ref 250–450)
UIBC: 505 ug/dL — ABNORMAL HIGH (ref 148–442)

## 2023-07-29 LAB — FERRITIN: Ferritin: 6 ng/mL — ABNORMAL LOW (ref 11–307)

## 2023-07-30 ENCOUNTER — Encounter: Payer: Self-pay | Admitting: Hematology and Oncology

## 2023-07-30 ENCOUNTER — Inpatient Hospital Stay: Payer: Medicaid Other | Attending: Hematology and Oncology | Admitting: Hematology and Oncology

## 2023-07-30 VITALS — BP 137/84 | HR 74 | Temp 98.0°F | Resp 18 | Ht 67.0 in | Wt 209.2 lb

## 2023-07-30 DIAGNOSIS — D500 Iron deficiency anemia secondary to blood loss (chronic): Secondary | ICD-10-CM | POA: Diagnosis not present

## 2023-07-30 DIAGNOSIS — N920 Excessive and frequent menstruation with regular cycle: Secondary | ICD-10-CM | POA: Diagnosis not present

## 2023-07-30 DIAGNOSIS — Z720 Tobacco use: Secondary | ICD-10-CM | POA: Diagnosis not present

## 2023-07-30 DIAGNOSIS — D219 Benign neoplasm of connective and other soft tissue, unspecified: Secondary | ICD-10-CM

## 2023-07-30 NOTE — Assessment & Plan Note (Signed)
The cause of her severe iron deficiency is due to menorrhagia We discussed importance of follow-up with gynecologist for discussion about treatment for uterine fibroids I can potentially prescribe Lupron for her in the future but I would not want to prescribe that when she is still smoking

## 2023-07-30 NOTE — Assessment & Plan Note (Signed)
We discussed importance of nicotine cessation The patient is motivated to quit

## 2023-07-30 NOTE — Assessment & Plan Note (Signed)
The most likely cause of her anemia is due to chronic blood loss/malabsorption syndrome. We discussed some of the risks, benefits, and alternatives of intravenous iron infusions. The patient is symptomatic from anemia and the iron level is critically low. She tolerated oral iron supplement poorly and desires to achieved higher levels of iron faster for adequate hematopoesis. Some of the side-effects to be expected including risks of infusion reactions, phlebitis, headaches, nausea and fatigue.  The patient is willing to proceed. Patient education material was dispensed.  Goal is to keep ferritin level greater than 50 and resolution of anemia Recommend 2 doses of intravenous iron Feraheme She needs to see her gynecologist for management of menorrhagia I plan to see her again early next year for further follow-up

## 2023-07-30 NOTE — Progress Notes (Signed)
[facility] OFFICE PROGRESS NOTE  Rebecca Fendt, NP  ASSESSMENT & PLAN:  Iron deficiency anemia due to chronic blood loss The most likely cause of her anemia is due to chronic blood loss/malabsorption syndrome. We discussed some of the risks, benefits, and alternatives of intravenous iron infusions. The patient is symptomatic from anemia and the iron level is critically low. She tolerated oral iron supplement poorly and desires to achieved higher levels of iron faster for adequate hematopoesis. Some of the side-effects to be expected including risks of infusion reactions, phlebitis, headaches, nausea and fatigue.  The patient is willing to proceed. Patient education material was dispensed.  Goal is to keep ferritin level greater than 50 and resolution of anemia Recommend 2 doses of intravenous iron Feraheme She needs to see her gynecologist for management of menorrhagia I plan to see her again early next year for further follow-up  Tobacco abuse We discussed importance of nicotine cessation The patient is motivated to quit  Fibroids The cause of her severe iron deficiency is due to menorrhagia We discussed importance of follow-up with gynecologist for discussion about treatment for uterine fibroids I can potentially prescribe Lupron for her in the future but I would not want to prescribe that when she is still smoking  Orders Placed This Encounter  Procedures   Ferritin    Standing Status:   Future    Standing Expiration Date:   07/29/2024   Iron and Iron Binding Capacity ([facility] only)    Standing Status:   Future    Standing Expiration Date:   07/29/2024   CBC with Differential (Cancer Center Only)    Standing Status:   Future    Standing Expiration Date:   07/29/2024    The total time spent in the appointment was 40 minutes encounter with patients including review of chart and various tests results, discussions about plan of care and coordination of care  plan   All questions were answered. The patient knows to call the clinic with any problems, questions or concerns. No barriers to learning was detected.    Artis Delay, MD 10/1/202410:01 AM  INTERVAL HISTORY: Rebecca Watson 42 y.o. female returns for further evaluation for severe iron deficiency anemia The patient has significant pica with ice crunching on the daily basis She has significant menorrhagia She has not been seen by her gynecologist since IUD placement She complained of significant fatigue She has no other forms of bleeding The patient has tried oral iron supplement without success of controlling her iron deficiency anemia We discussed risk and benefits of intravenous iron infusion  SUMMARY OF HEMATOLOGIC HISTORY:  Rebecca Watson 42 y.o. female is here because of anemia  She was found to have abnormal CBC from routine blood work I have the opportunity to review her blood count dated back to 2009 Her hemoglobin fluctuated between 10.8-13.4 Most recently, her hemoglobin was 10.8, MCV of 73 with a ferritin of 12 She denies recent chest pain on exertion, shortness of breath on minimal exertion, pre-syncopal episodes, or palpitations. She complains of fatigue.  She had pica with ice craving She has occasional hemorrhoidal bleeding once a month She had not noticed any recent bleeding such as epistaxis, hematuria or hemoptysis She complained of heavy menstruation.  She had several IUD placed and removed in the past.  The last IUD was removed around 2022 because it was not helpful to control menorrhagia Her menstrual cycle has been irregular recently. She is known  to have uterine fibroids but never had abnormal Pap smear She did not desire to have any more children  The patient denies over the counter NSAID ingestion. She is not on antiplatelets agents. Her last colonoscopy was performed on December 14, 2020 It showed diffuse diverticular changes with signs of colitis.   EGD showed grade a esophagitis.  Biopsy confirmed gastritis, duodenitis and focal colitis She had no prior history or diagnosis of cancer. Her age appropriate screening programs are up-to-date. She eats a variety of diet. She had donated blood before but has never received blood transfusion She tried to take oral iron supplement without success of bringing up her blood count She continues to have significant menorrhagia  I have reviewed the past medical history, past surgical history, social history and family history with the patient and they are unchanged from previous note.  ALLERGIES:  is allergic to flagyl [metronidazole].  MEDICATIONS:  Current Outpatient Medications  Medication Sig Dispense Refill   acetaminophen (TYLENOL) 500 MG tablet Take 1,000 mg by mouth every 6 (six) hours as needed for moderate pain or headache.     ferrous sulfate 325 (65 FE) MG EC tablet Take 325 mg by mouth daily.     FLUoxetine (PROZAC) 40 MG capsule Take 1 capsule (40 mg total) by mouth daily. 30 capsule 2   hydrOXYzine (VISTARIL) 25 MG capsule Take 1 capsule (25 mg total) by mouth every 8 (eight) hours as needed. 30 capsule 2   levocetirizine (XYZAL) 5 MG tablet Take 1 tablet (5 mg total) by mouth every evening. 30 tablet 2   No current facility-administered medications for this visit.     REVIEW OF SYSTEMS:   Constitutional: Denies fevers, chills or night sweats Eyes: Denies blurriness of vision Ears, nose, mouth, throat, and face: Denies mucositis or sore throat Respiratory: Denies cough, dyspnea or wheezes Cardiovascular: Denies palpitation, chest discomfort or lower extremity swelling Gastrointestinal:  Denies nausea, heartburn or change in bowel habits Skin: Denies abnormal skin rashes Lymphatics: Denies new lymphadenopathy or easy bruising Neurological:Denies numbness, tingling or new weaknesses Behavioral/Psych: Mood is stable, no new changes  All other systems were reviewed with the  patient and are negative.  PHYSICAL EXAMINATION: ECOG PERFORMANCE STATUS: 1 - Symptomatic but completely ambulatory  Vitals:   07/30/23 0924  BP: 137/84  Pulse: 74  Resp: 18  Temp: 98 F (36.7 C)  SpO2: 100%   Filed Weights   07/30/23 0924  Weight: 209 lb 3.2 oz (94.9 kg)    GENERAL:alert, no distress and comfortable   LABORATORY DATA:  I have reviewed the data as listed     Component Value Date/Time   NA 135 03/18/2023 1634   K 3.9 03/18/2023 1634   CL 102 03/18/2023 1634   CO2 17 (L) 03/18/2023 1634   GLUCOSE 60 (L) 03/18/2023 1634   GLUCOSE 62 (L) 03/08/2021 1133   BUN 11 03/18/2023 1634   CREATININE 0.74 03/18/2023 1634   CALCIUM 9.1 03/18/2023 1634   PROT 7.3 03/18/2023 1634   ALBUMIN 4.5 03/18/2023 1634   AST 14 03/18/2023 1634   ALT 5 03/18/2023 1634   ALKPHOS 83 03/18/2023 1634   BILITOT <0.2 03/18/2023 1634   GFRNONAA >60 10/03/2016 2206   GFRAA >60 10/03/2016 2206    No results found for: "SPEP", "UPEP"  Lab Results  Component Value Date   WBC 4.0 07/29/2023   NEUTROABS 2.4 07/29/2023   HGB 11.3 (L) 07/29/2023   HCT 37.8 07/29/2023   MCV  77.0 (L) 07/29/2023   PLT 303 07/29/2023      Chemistry      Component Value Date/Time   NA 135 03/18/2023 1634   K 3.9 03/18/2023 1634   CL 102 03/18/2023 1634   CO2 17 (L) 03/18/2023 1634   BUN 11 03/18/2023 1634   CREATININE 0.74 03/18/2023 1634      Component Value Date/Time   CALCIUM 9.1 03/18/2023 1634   ALKPHOS 83 03/18/2023 1634   AST 14 03/18/2023 1634   ALT 5 03/18/2023 1634   BILITOT <0.2 03/18/2023 1634

## 2023-08-12 ENCOUNTER — Inpatient Hospital Stay: Payer: Medicaid Other

## 2023-08-12 VITALS — BP 124/69 | HR 63 | Temp 98.4°F | Resp 16

## 2023-08-12 DIAGNOSIS — D500 Iron deficiency anemia secondary to blood loss (chronic): Secondary | ICD-10-CM | POA: Diagnosis not present

## 2023-08-12 MED ORDER — SODIUM CHLORIDE 0.9 % IV SOLN: Freq: Once | INTRAVENOUS | Status: AC

## 2023-08-12 MED ORDER — SODIUM CHLORIDE 0.9 % IV SOLN
510.0000 mg | Freq: Once | INTRAVENOUS | Status: AC
  Administered 2023-08-12: 510 mg via INTRAVENOUS
  Filled 2023-08-12: qty 17

## 2023-08-12 NOTE — Patient Instructions (Signed)
Iron Sucrose Injection What is this medication? IRON SUCROSE (EYE ern SOO krose) treats low levels of iron (iron deficiency anemia) in people with kidney disease. Iron is a mineral that plays an important role in making red blood cells, which carry oxygen from your lungs to the rest of your body. This medicine may be used for other purposes; ask your health care provider or pharmacist if you have questions. COMMON BRAND NAME(S): Venofer What should I tell my care team before I take this medication? They need to know if you have any of these conditions: Anemia not caused by low iron levels Heart disease High levels of iron in the blood Kidney disease Liver disease An unusual or allergic reaction to iron, other medications, foods, dyes, or preservatives Pregnant or trying to get pregnant Breastfeeding How should I use this medication? This medication is for infusion into a vein. It is given in a hospital or clinic setting. Talk to your care team about the use of this medication in children. While this medication may be prescribed for children as young as 2 years for selected conditions, precautions do apply. Overdosage: If you think you have taken too much of this medicine contact a poison control center or emergency room at once. NOTE: This medicine is only for you. Do not share this medicine with others. What if I miss a dose? Keep appointments for follow-up doses. It is important not to miss your dose. Call your care team if you are unable to keep an appointment. What may interact with this medication? Do not take this medication with any of the following: Deferoxamine Dimercaprol Other iron products This medication may also interact with the following: Chloramphenicol Deferasirox This list may not describe all possible interactions. Give your health care provider a list of all the medicines, herbs, non-prescription drugs, or dietary supplements you use. Also tell them if you smoke,  drink alcohol, or use illegal drugs. Some items may interact with your medicine. What should I watch for while using this medication? Visit your care team regularly. Tell your care team if your symptoms do not start to get better or if they get worse. You may need blood work done while you are taking this medication. You may need to follow a special diet. Talk to your care team. Foods that contain iron include: whole grains/cereals, dried fruits, beans, or peas, leafy green vegetables, and organ meats (liver, kidney). What side effects may I notice from receiving this medication? Side effects that you should report to your care team as soon as possible: Allergic reactions--skin rash, itching, hives, swelling of the face, lips, tongue, or throat Low blood pressure--dizziness, feeling faint or lightheaded, blurry vision Shortness of breath Side effects that usually do not require medical attention (report to your care team if they continue or are bothersome): Flushing Headache Joint pain Muscle pain Nausea Pain, redness, or irritation at injection site This list may not describe all possible side effects. Call your doctor for medical advice about side effects. You may report side effects to FDA at 1-800-FDA-1088. Where should I keep my medication? This medication is given in a hospital or clinic. It will not be stored at home. NOTE: This sheet is a summary. It may not cover all possible information. If you have questions about this medicine, talk to your doctor, pharmacist, or health care provider.  2024 Elsevier/Gold Standard (2023-03-22 00:00:00)

## 2023-08-19 ENCOUNTER — Inpatient Hospital Stay: Payer: Medicaid Other

## 2023-08-19 VITALS — BP 127/79 | HR 72 | Temp 98.2°F | Resp 14

## 2023-08-19 DIAGNOSIS — D500 Iron deficiency anemia secondary to blood loss (chronic): Secondary | ICD-10-CM | POA: Diagnosis not present

## 2023-08-19 MED ORDER — SODIUM CHLORIDE 0.9 % IV SOLN
510.0000 mg | Freq: Once | INTRAVENOUS | Status: AC
  Administered 2023-08-19: 510 mg via INTRAVENOUS
  Filled 2023-08-19: qty 510

## 2023-08-19 MED ORDER — SODIUM CHLORIDE 0.9 % IV SOLN: Freq: Once | INTRAVENOUS | Status: AC

## 2023-08-19 NOTE — Progress Notes (Signed)
Pt observed for 30 minutes post Feraheme infusion. Pt tolerated Tx well w/out incident. VSS at discharge.  Ambulatory to lobby.

## 2023-08-19 NOTE — Patient Instructions (Signed)
 Ferumoxytol Injection What is this medication? FERUMOXYTOL (FER ue MOX i tol) treats low levels of iron in your body (iron deficiency anemia). Iron is a mineral that plays an important role in making red blood cells, which carry oxygen from your lungs to the rest of your body. This medicine may be used for other purposes; ask your health care provider or pharmacist if you have questions. COMMON BRAND NAME(S): Feraheme What should I tell my care team before I take this medication? They need to know if you have any of these conditions: Anemia not caused by low iron levels High levels of iron in the blood Magnetic resonance imaging (MRI) test scheduled An unusual or allergic reaction to iron, other medications, foods, dyes, or preservatives Pregnant or trying to get pregnant Breastfeeding How should I use this medication? This medication is injected into a vein. It is given by your care team in a hospital or clinic setting. Talk to your care team the use of this medication in children. Special care may be needed. Overdosage: If you think you have taken too much of this medicine contact a poison control center or emergency room at once. NOTE: This medicine is only for you. Do not share this medicine with others. What if I miss a dose? It is important not to miss your dose. Call your care team if you are unable to keep an appointment. What may interact with this medication? Other iron products This list may not describe all possible interactions. Give your health care provider a list of all the medicines, herbs, non-prescription drugs, or dietary supplements you use. Also tell them if you smoke, drink alcohol, or use illegal drugs. Some items may interact with your medicine. What should I watch for while using this medication? Visit your care team regularly. Tell your care team if your symptoms do not start to get better or if they get worse. You may need blood work done while you are taking this  medication. You may need to follow a special diet. Talk to your care team. Foods that contain iron include: whole grains/cereals, dried fruits, beans, or peas, leafy green vegetables, and organ meats (liver, kidney). What side effects may I notice from receiving this medication? Side effects that you should report to your care team as soon as possible: Allergic reactions--skin rash, itching, hives, swelling of the face, lips, tongue, or throat Low blood pressure--dizziness, feeling faint or lightheaded, blurry vision Shortness of breath Side effects that usually do not require medical attention (report to your care team if they continue or are bothersome): Flushing Headache Joint pain Muscle pain Nausea Pain, redness, or irritation at injection site This list may not describe all possible side effects. Call your doctor for medical advice about side effects. You may report side effects to FDA at 1-800-FDA-1088. Where should I keep my medication? This medication is given in a hospital or clinic. It will not be stored at home. NOTE: This sheet is a summary. It may not cover all possible information. If you have questions about this medicine, talk to your doctor, pharmacist, or health care provider.  2024 Elsevier/Gold Standard (2023-03-22 00:00:00)

## 2023-08-21 ENCOUNTER — Encounter: Payer: Self-pay | Admitting: Family

## 2023-09-01 ENCOUNTER — Other Ambulatory Visit (HOSPITAL_COMMUNITY): Payer: Self-pay

## 2023-09-01 ENCOUNTER — Encounter: Payer: Self-pay | Admitting: Hematology and Oncology

## 2023-09-02 ENCOUNTER — Encounter: Payer: Self-pay | Admitting: Hematology and Oncology

## 2023-09-02 ENCOUNTER — Other Ambulatory Visit (HOSPITAL_COMMUNITY): Payer: Self-pay

## 2023-09-10 ENCOUNTER — Encounter: Payer: Self-pay | Admitting: Hematology and Oncology

## 2023-12-01 ENCOUNTER — Encounter: Payer: Self-pay | Admitting: Hematology and Oncology

## 2023-12-02 ENCOUNTER — Inpatient Hospital Stay: Payer: Self-pay

## 2023-12-12 ENCOUNTER — Telehealth: Payer: Self-pay | Admitting: Hematology and Oncology

## 2023-12-12 ENCOUNTER — Encounter: Payer: Self-pay | Admitting: Hematology and Oncology

## 2023-12-12 NOTE — Telephone Encounter (Signed)
Received a voicemail from the patient to cancel 2/14 appointment for insurance purposes. Left the patient a voicemail stating I would cancel the 2/14 appointment and to call back to reschedule.

## 2023-12-12 NOTE — Progress Notes (Signed)
Received voicemail from patient regarding cost concern and canceling visits.  Returned call to patient and left voicemail with options and my direct name and number to call with any additional financial questions or concerns.

## 2023-12-13 ENCOUNTER — Inpatient Hospital Stay: Payer: Self-pay | Admitting: Hematology and Oncology

## 2024-02-28 ENCOUNTER — Other Ambulatory Visit: Payer: Self-pay | Admitting: Physician Assistant

## 2024-02-28 DIAGNOSIS — M5410 Radiculopathy, site unspecified: Secondary | ICD-10-CM

## 2024-07-14 ENCOUNTER — Ambulatory Visit (HOSPITAL_COMMUNITY)
Admission: EM | Admit: 2024-07-14 | Discharge: 2024-07-14 | Disposition: A | Discharge status: Home / Self Care | Payer: Self-pay | Attending: Emergency Medicine | Admitting: Emergency Medicine

## 2024-07-14 ENCOUNTER — Ambulatory Visit (INDEPENDENT_AMBULATORY_CARE_PROVIDER_SITE_OTHER): Payer: Self-pay

## 2024-07-14 ENCOUNTER — Encounter (HOSPITAL_COMMUNITY): Payer: Self-pay

## 2024-07-14 DIAGNOSIS — M20021 Boutonniere deformity of right finger(s): Secondary | ICD-10-CM

## 2024-07-14 DIAGNOSIS — S6991XA Unspecified injury of right wrist, hand and finger(s), initial encounter: Secondary | ICD-10-CM

## 2024-07-14 NOTE — Discharge Instructions (Addendum)
 Xray does not show any broken or dislocated bones. However, as discussed, you have a deformity of your pinky finger that suggests a tendon is ruptured or injured.  You will need to see a hand specialist for this. Please call [facility] to set up an appointment with their hand specialist  In the meantime continue pain control at home with ibuprofen  and/or tylenol 

## 2024-07-14 NOTE — ED Triage Notes (Signed)
 Patient presents with right hand pinky finger injury. Patient states she fell  10-day ago and injured her pinky. Patient states swelling and pain. No medication has been taken for pain.

## 2024-07-14 NOTE — ED Provider Notes (Signed)
 [facility]    CSN: [id number] Arrival date & time: 07/14/24  1653     History   Chief Complaint Chief Complaint  Patient presents with   Finger Injury    HPI Rebecca Watson is a 43 y.o. female.  Here with right hand injury that occurred 10 days ago Reports she fell and caught herself with the hand Mostly the pinky was painful, but now the middle finger too. She feels its swollen. The pain has increased over the last few days  Has applied ice No other interventions yet  Past Medical History:  Diagnosis Date   Allergy    Anemia    low FE per pt    Constipation    Diverticulitis    Diverticulosis    Dry eyes    GERD (gastroesophageal reflux disease)    Obesity    Thyroid  disease    Trouble in sleeping     Patient Active Problem List   Diagnosis Date Noted   Iron deficiency anemia due to chronic blood loss 04/29/2023   Fibroids 04/29/2023   Tobacco abuse 04/29/2023   Vitamin D  deficiency 03/26/2023   Lumbar radiculopathy 11/14/2022   Vaginal discharge 10/12/2020   Anxiety and depression 10/12/2020   IUD (intrauterine device) in place 10/12/2020   Complex tear of lateral meniscus of right knee as current injury 08/04/2018   Unilateral primary osteoarthritis, right knee 08/04/2018   Chronic pain of right knee 08/04/2018   Endometriosis determined by laparoscopy 12/05/2017    Past Surgical History:  Procedure Laterality Date   CESAREAN SECTION     with twins  x 1    IUD REMOVAL     broken IUD removed    TUBAL LIGATION     x both     OB History     Gravida  2   Para  1   Term      Preterm  1   AB      Living  2      SAB      IAB      Ectopic      Multiple  1   Live Births  2            Home Medications    Prior to Admission medications   Medication Sig Start Date End Date Taking? Authorizing Provider  acetaminophen  (TYLENOL ) 500 MG tablet Take 1,000 mg by mouth every 6 (six) hours as needed for moderate pain  or headache.    [provider]  ferrous sulfate  325 (65 FE) MG EC tablet Take 325 mg by mouth daily.    [provider]  FLUoxetine  (PROZAC ) 40 MG capsule Take 1 capsule (40 mg total) by mouth daily. 04/23/23   Lorren Greig PARAS, NP  hydrOXYzine  (VISTARIL ) 25 MG capsule Take 1 capsule (25 mg total) by mouth every 8 (eight) hours as needed. 04/23/23   Lorren Greig PARAS, NP  levocetirizine (XYZAL ) 5 MG tablet Take 1 tablet (5 mg total) by mouth every evening. 04/23/23   Lorren Greig PARAS, NP    Family History Family History  Problem Relation Age of Onset   Bone cancer Mother        deceased age 26    Stroke Father    High blood pressure Father    COPD Father    Alcoholism Father    High Cholesterol Father    Colon cancer Neg Hx    Esophageal cancer Neg Hx  Colon polyps Neg Hx    Rectal cancer Neg Hx    Stomach cancer Neg Hx     Social History Social History   Tobacco Use   Smoking status: Every Day    Current packs/day: 0.50    Types: Cigarettes   Smokeless tobacco: Never  Vaping Use   Vaping status: Some Days   Start date: 10/29/2022  Substance Use Topics   Alcohol use: Yes    Comment: ocassionally maybe 1   Drug use: Yes    Types: Marijuana    Comment: weaning off - smokes some not daily. Smoke marijuana this am. 12-14-20 uses THC gumies at night     Allergies   Flagyl  [metronidazole ]   Review of Systems Review of Systems  As per HPI  Physical Exam Triage Vital Signs ED Triage Vitals  Encounter Vitals Group     BP 07/14/24 1817 (!) 163/101     Girls Systolic BP Percentile --      Girls Diastolic BP Percentile --      Boys Systolic BP Percentile --      Boys Diastolic BP Percentile --      Pulse Rate 07/14/24 1817 95     Resp 07/14/24 1817 18     Temp 07/14/24 1817 98.3 F (36.8 C)     Temp Source 07/14/24 1817 Oral     SpO2 07/14/24 1817 98 %     Weight --      Height --      Head Circumference --      Peak Flow --      Pain Score  07/14/24 1824 6     Pain Loc --      Pain Education --      Exclude from Growth Chart --    No data found.  Updated Vital Signs BP (!) 155/87   Pulse 95   Temp 98.3 F (36.8 C) (Oral)   Resp 18   LMP 07/01/2024 (Approximate)   SpO2 98%   Physical Exam Vitals and nursing note reviewed.  Constitutional:      General: She is not in acute distress. HENT:     Mouth/Throat:     Pharynx: Oropharynx is clear.  Cardiovascular:     Rate and Rhythm: Normal rate and regular rhythm.     Pulses: Normal pulses.     Heart sounds: Normal heart sounds.  Pulmonary:     Effort: Pulmonary effort is normal.     Breath sounds: Normal breath sounds.  Musculoskeletal:     Right hand: Deformity present.     Cervical back: Normal range of motion.     Comments: Boutonnire deformity of the right hand pinky finger. Mild swelling at PIP of middle finger. Distal sensation intact, cap refill < 2 seconds. Full ROM of wrist.  Skin:    General: Skin is warm and dry.     Capillary Refill: Capillary refill takes less than 2 seconds.  Neurological:     Mental Status: She is alert and oriented to person, place, and time.     UC Treatments / Results  Labs (all labs ordered are listed, but only abnormal results are displayed) Labs Reviewed - No data to display  EKG   Radiology DG Hand Complete Right Result Date: 07/14/2024 CLINICAL DATA:  10 days ago injured hand, deformity pinky finger and swelling middle EXAM: RIGHT HAND - COMPLETE 3+ VIEW COMPARISON:  None Available. FINDINGS: No acute fracture or dislocation. Apparent boutonniere deformity  of the fifth digit. Soft tissues are unremarkable. No radiopaque foreign body. IMPRESSION: Apparent boutonniere deformity of the fifth digit, worrisome for underlying tendon injury. Otherwise, no acute fracture or dislocation. Orthopedic consultation recommended. Electronically Signed   By: Rogelia Myers M.D.   On: 07/14/2024 19:12    Procedures Procedures  (including critical care time)  Medications Ordered in UC Medications - No data to display  Initial Impression / Assessment and Plan / UC Course  I have reviewed the triage vital signs and the nursing notes.  Pertinent labs & imaging results that were available during my care of the patient were reviewed by me and considered in my medical decision making (see chart for details).  Xray confirms boutonniere deformity, no other bony abnormality. Images independently reviewed by me, agree with radiology interpretation. Orthopedic follow up - [facility] information is provided. Call to make appointment. Pain control and supportive care at home. Patient agrees to plan, no questions   Final Clinical Impressions(s) / UC Diagnoses   Final diagnoses:  Injury of right hand, initial encounter  Boutonniere deformity of finger, right     Discharge Instructions      Xray does not show any broken or dislocated bones. However, as discussed, you have a deformity of your pinky finger that suggests a tendon is ruptured or injured.  You will need to see a hand specialist for this. Please call [facility] to set up an appointment with their hand specialist  In the meantime continue pain control at home with ibuprofen  and/or tylenol       ED Prescriptions   None    PDMP not reviewed this encounter.   Jeryl Asberry RIGGERS 07/14/24 1946

## 2024-08-31 ENCOUNTER — Encounter: Payer: Self-pay | Admitting: Radiology

## 2024-10-20 ENCOUNTER — Other Ambulatory Visit (HOSPITAL_COMMUNITY): Payer: Self-pay
